# Patient Record
Sex: Female | Born: 1950 | Race: Black or African American | Hispanic: No | Smoking: Never smoker
Health system: Southern US, Community
[De-identification: ages and names within clinical notes are randomized; demographics above are authoritative.]

## PROBLEM LIST (undated history)

## (undated) DIAGNOSIS — I10 Essential (primary) hypertension: Secondary | ICD-10-CM

## (undated) DIAGNOSIS — T4145XA Adverse effect of unspecified anesthetic, initial encounter: Secondary | ICD-10-CM

## (undated) DIAGNOSIS — C801 Malignant (primary) neoplasm, unspecified: Secondary | ICD-10-CM

## (undated) DIAGNOSIS — Z9109 Other allergy status, other than to drugs and biological substances: Secondary | ICD-10-CM

## (undated) DIAGNOSIS — K812 Acute cholecystitis with chronic cholecystitis: Principal | ICD-10-CM

## (undated) DIAGNOSIS — K8020 Calculus of gallbladder without cholecystitis without obstruction: Secondary | ICD-10-CM

## (undated) DIAGNOSIS — E039 Hypothyroidism, unspecified: Secondary | ICD-10-CM

## (undated) DIAGNOSIS — E079 Disorder of thyroid, unspecified: Secondary | ICD-10-CM

## (undated) DIAGNOSIS — D649 Anemia, unspecified: Secondary | ICD-10-CM

## (undated) DIAGNOSIS — T7840XA Allergy, unspecified, initial encounter: Secondary | ICD-10-CM

## (undated) HISTORY — DX: Disorder of thyroid, unspecified: E07.9

## (undated) HISTORY — PX: POLYPECTOMY: SHX149

## (undated) HISTORY — PX: DILATION AND CURETTAGE OF UTERUS: SHX78

## (undated) HISTORY — DX: Anemia, unspecified: D64.9

## (undated) HISTORY — DX: Other allergy status, other than to drugs and biological substances: Z91.09

## (undated) HISTORY — DX: Acute cholecystitis with chronic cholecystitis: K81.2

## (undated) HISTORY — PX: ABDOMINAL HYSTERECTOMY: SHX81

## (undated) HISTORY — PX: TUBAL LIGATION: SHX77

## (undated) HISTORY — DX: Essential (primary) hypertension: I10

## (undated) HISTORY — PX: SIGMOIDOSCOPY: SUR1295

## (undated) HISTORY — DX: Allergy, unspecified, initial encounter: T78.40XA

## (undated) HISTORY — PX: COLONOSCOPY: SHX174

## (undated) HISTORY — PX: COLON SURGERY: SHX602

## (undated) HISTORY — DX: Malignant (primary) neoplasm, unspecified: C80.1

## (undated) HISTORY — DX: Calculus of gallbladder without cholecystitis without obstruction: K80.20

---

## 1998-07-29 ENCOUNTER — Inpatient Hospital Stay (HOSPITAL_COMMUNITY): Admission: RE | Admit: 1998-07-29 | Discharge: 1998-07-31 | Payer: Self-pay | Admitting: Gynecology

## 1998-12-08 ENCOUNTER — Other Ambulatory Visit: Admission: RE | Admit: 1998-12-08 | Discharge: 1998-12-08 | Payer: Self-pay | Admitting: Gynecology

## 1999-08-17 ENCOUNTER — Emergency Department (HOSPITAL_COMMUNITY): Admission: EM | Admit: 1999-08-17 | Discharge: 1999-08-17 | Payer: Self-pay | Admitting: Emergency Medicine

## 1999-08-17 ENCOUNTER — Encounter: Payer: Self-pay | Admitting: Emergency Medicine

## 1999-08-26 ENCOUNTER — Emergency Department (HOSPITAL_COMMUNITY): Admission: EM | Admit: 1999-08-26 | Discharge: 1999-08-26 | Payer: Self-pay | Admitting: Internal Medicine

## 1999-08-30 ENCOUNTER — Emergency Department (HOSPITAL_COMMUNITY): Admission: EM | Admit: 1999-08-30 | Discharge: 1999-08-30 | Payer: Self-pay

## 1999-10-07 ENCOUNTER — Encounter: Payer: Self-pay | Admitting: Internal Medicine

## 1999-10-07 ENCOUNTER — Encounter: Admission: RE | Admit: 1999-10-07 | Discharge: 1999-10-07 | Payer: Self-pay | Admitting: Internal Medicine

## 1999-12-13 ENCOUNTER — Other Ambulatory Visit: Admission: RE | Admit: 1999-12-13 | Discharge: 1999-12-13 | Payer: Self-pay | Admitting: Gynecology

## 2000-10-10 ENCOUNTER — Encounter: Payer: Self-pay | Admitting: Internal Medicine

## 2000-10-10 ENCOUNTER — Encounter: Admission: RE | Admit: 2000-10-10 | Discharge: 2000-10-10 | Payer: Self-pay | Admitting: Internal Medicine

## 2000-12-13 ENCOUNTER — Other Ambulatory Visit: Admission: RE | Admit: 2000-12-13 | Discharge: 2000-12-13 | Payer: Self-pay | Admitting: Gynecology

## 2001-10-11 ENCOUNTER — Encounter: Payer: Self-pay | Admitting: Internal Medicine

## 2001-10-11 ENCOUNTER — Encounter: Admission: RE | Admit: 2001-10-11 | Discharge: 2001-10-11 | Payer: Self-pay | Admitting: Internal Medicine

## 2001-12-14 ENCOUNTER — Other Ambulatory Visit: Admission: RE | Admit: 2001-12-14 | Discharge: 2001-12-14 | Payer: Self-pay | Admitting: Gynecology

## 2002-10-14 ENCOUNTER — Encounter: Admission: RE | Admit: 2002-10-14 | Discharge: 2002-10-14 | Payer: Self-pay | Admitting: Internal Medicine

## 2002-10-14 ENCOUNTER — Encounter: Payer: Self-pay | Admitting: Internal Medicine

## 2002-11-22 ENCOUNTER — Ambulatory Visit (HOSPITAL_COMMUNITY): Admission: RE | Admit: 2002-11-22 | Discharge: 2002-11-22 | Payer: Self-pay | Admitting: Internal Medicine

## 2003-01-01 ENCOUNTER — Other Ambulatory Visit: Admission: RE | Admit: 2003-01-01 | Discharge: 2003-01-01 | Payer: Self-pay | Admitting: Gynecology

## 2003-10-15 ENCOUNTER — Encounter: Admission: RE | Admit: 2003-10-15 | Discharge: 2003-10-15 | Payer: Self-pay | Admitting: Internal Medicine

## 2003-12-15 ENCOUNTER — Other Ambulatory Visit: Admission: RE | Admit: 2003-12-15 | Discharge: 2003-12-15 | Payer: Self-pay | Admitting: Gynecology

## 2004-11-03 ENCOUNTER — Encounter: Admission: RE | Admit: 2004-11-03 | Discharge: 2004-11-03 | Payer: Self-pay | Admitting: Internal Medicine

## 2006-10-05 ENCOUNTER — Ambulatory Visit (HOSPITAL_COMMUNITY): Admission: RE | Admit: 2006-10-05 | Discharge: 2006-10-05 | Payer: Self-pay | Admitting: Internal Medicine

## 2006-10-24 ENCOUNTER — Encounter: Admission: RE | Admit: 2006-10-24 | Discharge: 2006-10-24 | Payer: Self-pay | Admitting: Internal Medicine

## 2008-02-11 ENCOUNTER — Ambulatory Visit (HOSPITAL_COMMUNITY): Admission: RE | Admit: 2008-02-11 | Discharge: 2008-02-11 | Payer: Self-pay | Admitting: Internal Medicine

## 2009-02-24 ENCOUNTER — Ambulatory Visit (HOSPITAL_COMMUNITY): Admission: RE | Admit: 2009-02-24 | Discharge: 2009-02-24 | Payer: Self-pay | Admitting: Internal Medicine

## 2009-03-04 ENCOUNTER — Encounter: Admission: RE | Admit: 2009-03-04 | Discharge: 2009-03-04 | Payer: Self-pay | Admitting: Internal Medicine

## 2009-12-01 ENCOUNTER — Encounter (INDEPENDENT_AMBULATORY_CARE_PROVIDER_SITE_OTHER): Payer: Self-pay | Admitting: *Deleted

## 2010-03-12 ENCOUNTER — Ambulatory Visit (HOSPITAL_COMMUNITY): Admission: RE | Admit: 2010-03-12 | Discharge: 2010-03-12 | Payer: Self-pay | Admitting: Internal Medicine

## 2010-08-02 ENCOUNTER — Encounter: Payer: Self-pay | Admitting: Internal Medicine

## 2010-08-12 NOTE — Letter (Signed)
Summary: Colonoscopy Letter  [facility]  [address] [place], [place] [address]   Phone: [phone]  Fax: [phone]      Dec 01, 2009 MRN: [id number]   [facility] [address] [place], [place]  [address]   Dear Christine Shepherd,   According to your medical record, it is time for you to schedule a Colonoscopy. The American Cancer Society recommends this procedure as a method to detect early colon cancer. Patients with a family history of colon cancer, or a personal history of colon polyps or inflammatory bowel disease are at increased risk.  This letter has beeen generated based on the recommendations made at the time of your procedure. If you feel that in your particular situation this may no longer apply, please contact our office.  Please call our office at [phone] to schedule this appointment or to update your records at your earliest convenience.  Thank you for cooperating with [place] to provide you with the very best care possible.   Sincerely,  Hedwig Morton. Juanda Chance, M.D.  [facility] [phone]

## 2011-02-16 ENCOUNTER — Telehealth: Payer: Self-pay | Admitting: *Deleted

## 2011-02-16 NOTE — Telephone Encounter (Signed)
Patient had a colonoscopy 12/10/02 which showed colon polyps. She is now overdue for colonoscopy (5-7 year recall). I have left a message for patient to call back.

## 2011-02-23 NOTE — Telephone Encounter (Signed)
Left message for patient to call back  

## 2011-03-02 NOTE — Telephone Encounter (Signed)
Patient never call back. We will send a letter.

## 2011-03-03 ENCOUNTER — Other Ambulatory Visit (HOSPITAL_COMMUNITY): Payer: Self-pay | Admitting: Internal Medicine

## 2011-03-03 DIAGNOSIS — Z1231 Encounter for screening mammogram for malignant neoplasm of breast: Secondary | ICD-10-CM

## 2011-03-15 ENCOUNTER — Ambulatory Visit (HOSPITAL_COMMUNITY)
Admission: RE | Admit: 2011-03-15 | Discharge: 2011-03-15 | Disposition: A | Discharge status: Home / Self Care | Payer: BC Managed Care – PPO | Source: Ambulatory Visit | Attending: Internal Medicine | Admitting: Internal Medicine

## 2011-03-15 DIAGNOSIS — Z1231 Encounter for screening mammogram for malignant neoplasm of breast: Secondary | ICD-10-CM | POA: Insufficient documentation

## 2011-08-24 ENCOUNTER — Ambulatory Visit (INDEPENDENT_AMBULATORY_CARE_PROVIDER_SITE_OTHER): Payer: BC Managed Care – PPO | Admitting: Physician Assistant

## 2011-08-24 VITALS — BP 178/86 | HR 76 | Temp 98.6°F | Resp 16 | Ht 64.5 in | Wt 171.0 lb

## 2011-08-24 DIAGNOSIS — R110 Nausea: Secondary | ICD-10-CM

## 2011-08-24 DIAGNOSIS — R82998 Other abnormal findings in urine: Secondary | ICD-10-CM

## 2011-08-24 DIAGNOSIS — R8271 Bacteriuria: Secondary | ICD-10-CM

## 2011-08-24 LAB — POCT CBC
Granulocyte percent: 46.4 %G (ref 37–80)
MCV: 90.7 fL (ref 80–97)
MID (cbc): 0.3 (ref 0–0.9)
POC Granulocyte: 2 (ref 2–6.9)
POC LYMPH PERCENT: 45.8 %L (ref 10–50)
Platelet Count, POC: 236 10*3/uL (ref 142–424)
RBC: 4.58 M/uL (ref 4.04–5.48)
RDW, POC: 13.4 %

## 2011-08-24 LAB — POCT URINALYSIS DIPSTICK
Leukocytes, UA: NEGATIVE
Protein, UA: 30
Spec Grav, UA: 1.025
Urobilinogen, UA: 0.2
pH, UA: 6

## 2011-08-24 LAB — GLUCOSE, POCT (MANUAL RESULT ENTRY): POC Glucose: 106

## 2011-08-24 LAB — POCT UA - MICROSCOPIC ONLY
Casts, Ur, LPF, POC: NEGATIVE
Crystals, Ur, HPF, POC: NEGATIVE

## 2011-08-24 MED ORDER — ONDANSETRON 8 MG PO TBDP: 8.0000 mg | ORAL_TABLET | Freq: Three times a day (TID) | ORAL | Status: AC | PRN

## 2011-08-24 NOTE — Progress Notes (Signed)
  Subjective:    Patient ID: Christine Shepherd, female    DOB: [birth year], 61 y.o.   MRN: [id number]  HPI This patient presents complaining of GI upset. A little bit of nausea but no vomiting. No diarrhea. She had similar symptoms about 2 weeks ago and saw her primary care provider. Her symptoms improved until yesterday. She is frustrated because she thought she was "over this."  No urinary symptoms. No melena or hematochezia. No headache, dizziness or visual disturbance. No unusual foods. No change in medications. New activities.   Review of Systems As above.    Objective:   Physical Exam Vital signs are noted. She states that her blood pressure is usually up at the doctor's office. She is awake, alert and oriented in no acute distress. Neck is supple and nontender without lymphadenopathy or thyromegaly. Heart and lungs are clear to auscultation. Abdomen has normoactive bowel sounds, is supple and nontender without masses or organomegaly. Peripheral pulses are symmetrically strong. No rash.  Results for orders placed in visit on 08/24/11  POCT UA - MICROSCOPIC ONLY      Component Value Range   WBC, Ur, HPF, POC 0-2     RBC, urine, microscopic neg     Bacteria, U Microscopic 3+     Mucus, UA mod     Epithelial cells, urine per micros 10-15     Crystals, Ur, HPF, POC neg     Casts, Ur, LPF, POC neg     Yeast, UA neg    POCT URINALYSIS DIPSTICK      Component Value Range   Color, UA yellow     Clarity, UA clear     Glucose, UA neg     Bilirubin, UA neg     Ketones, UA trace     Spec Grav, UA 1.025     Blood, UA neg     pH, UA 6.0     Protein, UA 30     Urobilinogen, UA 0.2     Nitrite, UA neg     Leukocytes, UA Negative    GLUCOSE, POCT (MANUAL RESULT ENTRY)      Component Value Range   POC Glucose 106    POCT CBC      Component Value Range   WBC 4.3 (*) 4.6 - 10.2 (K/uL)   Lymph, poc 2.0  0.6 - 3.4    POC LYMPH PERCENT 45.8  10 - 50 (%L)   MID (cbc) 0.3  0 - 0.9    POC MID  % 7.8  0 - 12 (%M)   POC Granulocyte 2.0  2 - 6.9    Granulocyte percent 46.4  37 - 80 (%G)   RBC 4.58  4.04 - 5.48 (M/uL)   Hemoglobin 13.2  12.2 - 16.2 (g/dL)   HCT, POC 30.8  65.7 - 47.9 (%)   MCV 90.7  80 - 97 (fL)   MCH, POC 28.8  27 - 31.2 (pg)   MCHC 31.8  31.8 - 35.4 (g/dL)   RDW, POC 84.6     Platelet Count, POC 236  142 - 424 (K/uL)   MPV 10.1  0 - 99.8 (fL)   Urine culture is pending.     Assessment & Plan:  Nausea, GI upset. I suspect this is a relapse of a recent GI viral illness. Supportive care. I doubt a UTI but given the bacteria in the urine a urine culture has been sent. Zofran when necessary. Anticipatory guidance provided.

## 2011-08-24 NOTE — Patient Instructions (Signed)
Be sure to drink plenty of liquids and get as much rest as possible.  Your urine has been sent out for a culture to verify that you do not have a urinary tract infection causing your symptoms.

## 2011-08-24 NOTE — Progress Notes (Signed)
  Subjective:    Patient ID: Christine Shepherd, female    DOB: 1950/10/19, 61 y.o.   MRN: [id number]  HPI    Review of Systems     Objective:   Physical Exam Neck exam is remarkable for enlarged thyroid on the right, patient states this is her baseline. No discrete masses or nodules. No cervical lymphadenopathy. No clavicular lymphadenopathy.       Assessment & Plan:

## 2011-08-26 LAB — URINE CULTURE: Colony Count: 70000

## 2011-08-31 ENCOUNTER — Telehealth: Payer: Self-pay | Admitting: Radiology

## 2011-08-31 NOTE — Telephone Encounter (Signed)
I spoke to patient about her urine culture advised to return if not better, she is feeling much better. She will call back if further is needed Christine Shepherd

## 2011-11-13 ENCOUNTER — Encounter (HOSPITAL_COMMUNITY): Payer: Self-pay

## 2011-11-13 ENCOUNTER — Emergency Department (HOSPITAL_COMMUNITY)
Admission: EM | Admit: 2011-11-13 | Discharge: 2011-11-13 | Disposition: A | Discharge status: Home / Self Care | Payer: BC Managed Care – PPO | Attending: Emergency Medicine | Admitting: Emergency Medicine

## 2011-11-13 ENCOUNTER — Emergency Department (HOSPITAL_COMMUNITY): Payer: BC Managed Care – PPO

## 2011-11-13 DIAGNOSIS — E876 Hypokalemia: Secondary | ICD-10-CM | POA: Insufficient documentation

## 2011-11-13 DIAGNOSIS — K8020 Calculus of gallbladder without cholecystitis without obstruction: Secondary | ICD-10-CM | POA: Insufficient documentation

## 2011-11-13 DIAGNOSIS — E079 Disorder of thyroid, unspecified: Secondary | ICD-10-CM | POA: Insufficient documentation

## 2011-11-13 DIAGNOSIS — I10 Essential (primary) hypertension: Secondary | ICD-10-CM | POA: Insufficient documentation

## 2011-11-13 DIAGNOSIS — R1013 Epigastric pain: Secondary | ICD-10-CM | POA: Insufficient documentation

## 2011-11-13 DIAGNOSIS — R112 Nausea with vomiting, unspecified: Secondary | ICD-10-CM | POA: Insufficient documentation

## 2011-11-13 LAB — COMPREHENSIVE METABOLIC PANEL
ALT: 14 U/L (ref 0–35)
Albumin: 4 g/dL (ref 3.5–5.2)
Alkaline Phosphatase: 45 U/L (ref 39–117)
Calcium: 9.8 mg/dL (ref 8.4–10.5)
GFR calc Af Amer: 90 mL/min — ABNORMAL LOW (ref >90)
Glucose, Bld: 208 mg/dL — ABNORMAL HIGH (ref 70–99)
Potassium: 2.6 mEq/L — CL (ref 3.5–5.1)
Sodium: 136 mEq/L (ref 135–145)
Total Protein: 7.5 g/dL (ref 6.0–8.3)

## 2011-11-13 LAB — URINALYSIS, ROUTINE W REFLEX MICROSCOPIC
Bilirubin Urine: NEGATIVE
Glucose, UA: >1000 mg/dL — AB
Leukocytes, UA: NEGATIVE
Nitrite: NEGATIVE
Specific Gravity, Urine: 1.027 (ref 1.005–1.030)
pH: 7.5 (ref 5.0–8.0)

## 2011-11-13 LAB — URINE MICROSCOPIC-ADD ON

## 2011-11-13 LAB — DIFFERENTIAL
Basophils Absolute: 0 10*3/uL (ref 0.0–0.1)
Basophils Relative: 0 % (ref 0–1)
Eosinophils Absolute: 0 10*3/uL (ref 0.0–0.7)
Eosinophils Relative: 0 % (ref 0–5)
Lymphs Abs: 0.6 10*3/uL — ABNORMAL LOW (ref 0.7–4.0)
Neutrophils Relative %: 90 % — ABNORMAL HIGH (ref 43–77)

## 2011-11-13 LAB — CBC
MCH: 30.2 pg (ref 26.0–34.0)
MCHC: 34.2 g/dL (ref 30.0–36.0)
MCV: 88.3 fL (ref 78.0–100.0)
Platelets: 215 10*3/uL (ref 150–400)
RBC: 4.2 MIL/uL (ref 3.87–5.11)
RDW: 13.2 % (ref 11.5–15.5)

## 2011-11-13 MED ORDER — POTASSIUM CHLORIDE CRYS ER 20 MEQ PO TBCR
40.0000 meq | EXTENDED_RELEASE_TABLET | Freq: Once | ORAL | Status: AC
  Administered 2011-11-13: 40 meq via ORAL
  Filled 2011-11-13: qty 2

## 2011-11-13 MED ORDER — SODIUM CHLORIDE 0.9 % IV SOLN
1000.0000 mL | Freq: Once | INTRAVENOUS | Status: AC
  Administered 2011-11-13: 1000 mL via INTRAVENOUS

## 2011-11-13 MED ORDER — ONDANSETRON HCL 4 MG/2ML IJ SOLN
4.0000 mg | Freq: Once | INTRAMUSCULAR | Status: AC
  Administered 2011-11-13: 4 mg via INTRAVENOUS
  Filled 2011-11-13: qty 2

## 2011-11-13 MED ORDER — POTASSIUM CHLORIDE 10 MEQ/100ML IV SOLN
10.0000 meq | Freq: Once | INTRAVENOUS | Status: AC
  Administered 2011-11-13: 10 meq via INTRAVENOUS
  Filled 2011-11-13: qty 100

## 2011-11-13 MED ORDER — HYDROMORPHONE HCL PF 1 MG/ML IJ SOLN
1.0000 mg | Freq: Once | INTRAMUSCULAR | Status: AC
  Administered 2011-11-13: 1 mg via INTRAVENOUS
  Filled 2011-11-13: qty 1

## 2011-11-13 MED ORDER — ONDANSETRON 8 MG PO TBDP: 8.0000 mg | ORAL_TABLET | Freq: Three times a day (TID) | ORAL | Status: AC | PRN

## 2011-11-13 MED ORDER — HYDROCODONE-ACETAMINOPHEN 5-325 MG PO TABS: 1.0000 | ORAL_TABLET | Freq: Four times a day (QID) | ORAL | Status: DC | PRN

## 2011-11-13 MED ORDER — SODIUM CHLORIDE 0.9 % IV SOLN
1000.0000 mL | INTRAVENOUS | Status: DC
  Administered 2011-11-13: 1000 mL via INTRAVENOUS

## 2011-11-13 NOTE — ED Notes (Signed)
Pt states that she began having "gas pains" around 3 am.  Pt has been having NV, denies diarrhea.  Took a laxative to help but it hasn't.

## 2011-11-13 NOTE — ED Notes (Signed)
Pt in from home with acute onset of abd pain with n/vdenies diarrhea states last bm was this am states pain is mid abd area radiating to the back states pain sharp at times

## 2011-11-13 NOTE — Discharge Instructions (Signed)
Gallstones Gallstones are a form of gallbladder disease. The gallbladder is a small organ that helps you digest food.  HOME CARE  Only take medicine as told by your doctor.   Eat a low-fat diet.   Follow up as told.  GET HELP RIGHT AWAY IF:   Your pain gets worse.   You develop yellow skin or eyes (jaundice).   The pain moves to another part of your belly (abdomen) or back.   You have a temperature by mouth above 102 F (38.9 C), not controlled by medicine.   You feel sick to your stomach (nauseous) and throw up (vomit).  MAKE SURE YOU:   Understand these instructions.   Will watch your condition.   Will get help right away if you are not doing well or get worse.  Document Released: 12/14/2007 Document Revised: 06/16/2011 Document Reviewed: 06/02/2009 [facility] Patient Information 2012 [place], [place].Cholesterol Control Diet Cholesterol levels in your body are determined significantly by your diet. Cholesterol levels may also be related to heart disease. The following material helps to explain this relationship and discusses what you can do to help keep your heart healthy. Not all cholesterol is bad. Low-density lipoprotein (LDL) cholesterol is the "bad" cholesterol. It may cause fatty deposits to build up inside your arteries. High-density lipoprotein (HDL) cholesterol is "good." It helps to remove the "bad" LDL cholesterol from your blood. Cholesterol is a very important risk factor for heart disease. Other risk factors are high blood pressure, smoking, stress, heredity, and weight. The heart muscle gets its supply of blood through the coronary arteries. If your LDL cholesterol is high and your HDL cholesterol is low, you are at risk for having fatty deposits build up in your coronary arteries. This leaves less room through which blood can flow. Without sufficient blood and oxygen, the heart muscle cannot function properly and you may feel chest pains (angina pectoris). When a  coronary artery closes up entirely, a part of the heart muscle may die, causing a heart attack (myocardial infarction). CHECKING CHOLESTEROL When your caregiver sends your blood to a lab to be analyzed for cholesterol, a complete lipid (fat) profile may be done. With this test, the total amount of cholesterol and levels of LDL and HDL are determined. Triglycerides are a type of fat that circulates in the blood and can also be used to determine heart disease risk. The list below describes what the numbers should be: Test: Total Cholesterol.  Less than 200 mg/dl.  Test: LDL "bad cholesterol."  Less than 100 mg/dl.   Less than 70 mg/dl if you are at very high risk of a heart attack or sudden cardiac death.  Test: HDL "good cholesterol."  Greater than 50 mg/dl for women.   Greater than 40 mg/dl for men.  Test: Triglycerides.  Less than 150 mg/dl.  CONTROLLING CHOLESTEROL WITH DIET Although exercise and lifestyle factors are important, your diet is key. That is because certain foods are known to raise cholesterol and others to lower it. The goal is to balance foods for their effect on cholesterol and more importantly, to replace saturated and trans fat with other types of fat, such as monounsaturated fat, polyunsaturated fat, and omega-3 fatty acids. On average, a person should consume no more than 15 to 17 g of saturated fat daily. Saturated and trans fats are considered "bad" fats, and they will raise LDL cholesterol. Saturated fats are primarily found in animal products such as meats, butter, and cream. However, that does not  mean you need to sacrifice all your favorite foods. Today, there are good tasting, low-fat, low-cholesterol substitutes for most of the things you like to eat. Choose low-fat or nonfat alternatives. Choose round or loin cuts of red meat, since these types of cuts are lowest in fat and cholesterol. Chicken (without the skin), fish, veal, and ground [place] breast are excellent  choices. Eliminate fatty meats, such as hot dogs and salami. Even shellfish have little or no saturated fat. Have a 3 oz (85 g) portion when you eat lean meat, poultry, or fish. Trans fats are also called "partially hydrogenated oils." They are oils that have been scientifically manipulated so that they are solid at room temperature resulting in a longer shelf life and improved taste and texture of foods in which they are added. Trans fats are found in stick margarine, some tub margarines, cookies, crackers, and baked goods.  When baking and cooking, oils are an excellent substitute for butter. The monounsaturated oils are especially beneficial since it is believed they lower LDL and raise HDL. The oils you should avoid entirely are saturated tropical oils, such as coconut and palm.  Remember to eat liberally from food groups that are naturally free of saturated and trans fat, including fish, fruit, vegetables, beans, grains (barley, rice, couscous, bulgur wheat), and pasta (without cream sauces).  IDENTIFYING FOODS THAT LOWER CHOLESTEROL  Soluble fiber may lower your cholesterol. This type of fiber is found in fruits such as apples, vegetables such as broccoli, potatoes, and carrots, legumes such as beans, peas, and lentils, and grains such as barley. Foods fortified with plant sterols (phytosterol) may also lower cholesterol. You should eat at least 2 g per day of these foods for a cholesterol lowering effect.  Read package labels to identify low-saturated fats, trans fats free, and low-fat foods at the supermarket. Select cheeses that have only 2 to 3 g saturated fat per ounce. Use a heart-healthy tub margarine that is free of trans fats or partially hydrogenated oil. When buying baked goods (cookies, crackers), avoid partially hydrogenated oils. Breads and muffins should be made from whole grains (whole-wheat or whole oat flour, instead of "flour" or "enriched flour"). Buy non-creamy canned soups with  reduced salt and no added fats.  FOOD PREPARATION TECHNIQUES  Never deep-fry. If you must fry, either stir-fry, which uses very little fat, or use non-stick cooking sprays. When possible, broil, bake, or roast meats, and steam vegetables. Instead of dressing vegetables with butter or margarine, use lemon and herbs, applesauce and cinnamon (for squash and sweet potatoes), nonfat yogurt, salsa, and low-fat dressings for salads.  LOW-SATURATED FAT / LOW-FAT FOOD SUBSTITUTES Meats / Saturated Fat (g)  Avoid: Steak, marbled (3 oz/85 g) / 11 g   Choose: Steak, lean (3 oz/85 g) / 4 g   Avoid: Hamburger (3 oz/85 g) / 7 g   Choose: Hamburger, lean (3 oz/85 g) / 5 g   Avoid: Ham (3 oz/85 g) / 6 g   Choose: Ham, lean cut (3 oz/85 g) / 2.4 g   Avoid: Chicken, with skin, dark meat (3 oz/85 g) / 4 g   Choose: Chicken, skin removed, dark meat (3 oz/85 g) / 2 g   Avoid: Chicken, with skin, light meat (3 oz/85 g) / 2.5 g   Choose: Chicken, skin removed, light meat (3 oz/85 g) / 1 g  Dairy / Saturated Fat (g)  Avoid: Whole milk (1 cup) / 5 g   Choose: Low-fat milk, 2% (  1 cup) / 3 g   Choose: Low-fat milk, 1% (1 cup) / 1.5 g   Choose: Skim milk (1 cup) / 0.3 g   Avoid: Hard cheese (1 oz/28 g) / 6 g   Choose: Skim milk cheese (1 oz/28 g) / 2 to 3 g   Avoid: Cottage cheese, 4% fat (1 cup) / 6.5 g   Choose: Low-fat cottage cheese, 1% fat (1 cup) / 1.5 g   Avoid: Ice cream (1 cup) / 9 g   Choose: Sherbet (1 cup) / 2.5 g   Choose: Nonfat frozen yogurt (1 cup) / 0.3 g   Choose: Frozen fruit bar / trace   Avoid: Whipped cream (1 tbs) / 3.5 g   Choose: Nondairy whipped topping (1 tbs) / 1 g  Condiments / Saturated Fat (g)  Avoid: Mayonnaise (1 tbs) / 2 g   Choose: Low-fat mayonnaise (1 tbs) / 1 g   Avoid: Butter (1 tbs) / 7 g   Choose: Extra light margarine (1 tbs) / 1 g   Avoid: Coconut oil (1 tbs) / 11.8 g   Choose: Olive oil (1 tbs) / 1.8 g   Choose: Corn oil (1 tbs) / 1.7  g   Choose: Safflower oil (1 tbs) / 1.2 g   Choose: Sunflower oil (1 tbs) / 1.4 g   Choose: Soybean oil (1 tbs) / 2.4 g   Choose: Canola oil (1 tbs) / 1 g  Document Released: 06/27/2005 Document Revised: 06/16/2011 Document Reviewed: 12/16/2010 [facility] Patient Information 2012 [place], [place].

## 2011-11-13 NOTE — ED Provider Notes (Signed)
History     CSN: [id number]  Arrival date & time 11/13/11  5621   First MD Initiated Contact with Patient 11/13/11 0840      Chief Complaint  Patient presents with  . Abdominal Pain  . Nausea  . Emesis    HPI Patient presents to the emergency room with complaints of abdominal pain associated with nausea and vomiting. Patient states she woke up at about 3 AM with severe pain in her upper abdomen that radiates somewhat to her back. The pain is sharp and severe. She feels like she somewhat bloated. Patient denies any diarrhea. She did have a bowel movement this morning. She has not noticed any blood or urine, dysuria, fever, coughing, chest pain. Nothing seems to making this pain better. Palpation and movement do make it worse. The patient never his symptoms like this before.  Her only prior surgical history is a hysterectomy.  Past Medical History  Diagnosis Date  . Hypertension   . Thyroid disease     Past Surgical History  Procedure Date  . Abdominal hysterectomy     Family History  Problem Relation Age of Onset  . Cancer Father     Prostate Ca    History  Substance Use Topics  . Smoking status: Never Smoker   . Smokeless tobacco: Not on file  . Alcohol Use: No    OB History    Grav Para Term Preterm Abortions TAB SAB Ect Mult Living                  Review of Systems  All other systems reviewed and are negative.    Allergies  Review of patient's allergies indicates no known allergies.  Home Medications   Current Outpatient Rx  Name Route Sig Dispense Refill  . ESTRADIOL 0.05 MG/24HR TD PTTW Transdermal Place 1 patch onto the skin 2 (two) times a week.    [name] LEVOTHYROXINE SODIUM 75 MCG PO TABS Oral Take 75 mcg by mouth daily.    [name] VALSARTAN-HYDROCHLOROTHIAZIDE 160-25 MG PO TABS Oral Take 1 tablet by mouth daily.      BP 163/62  Pulse 83  Temp(Src) 98 F (36.7 C) (Oral)  Resp 20  SpO2 100%  Physical Exam  Nursing note and vitals  reviewed. Constitutional: She appears well-developed and well-nourished. She appears distressed.  HENT:  Head: Normocephalic and atraumatic.  Right Ear: External ear normal.  Left Ear: External ear normal.  Eyes: Conjunctivae are normal. Right eye exhibits no discharge. Left eye exhibits no discharge. No scleral icterus.  Neck: Neck supple. No tracheal deviation present.  Cardiovascular: Normal rate, regular rhythm and intact distal pulses.   Pulmonary/Chest: Effort normal and breath sounds normal. No stridor. No respiratory distress. She has no wheezes. She has no rales.  Abdominal: Soft. Bowel sounds are normal. She exhibits no distension. There is tenderness in the epigastric area. There is guarding. There is no rigidity, no rebound and no CVA tenderness. No hernia.  Musculoskeletal: She exhibits no edema and no tenderness.  Neurological: She is alert. She has normal strength. No sensory deficit. Cranial nerve deficit:  no gross defecits noted. She exhibits normal muscle tone. She displays no seizure activity. Coordination normal.  Skin: Skin is warm and dry. No rash noted.  Psychiatric: She has a normal mood and affect.    ED Course  Procedures  ED TREATMENT:  Medications  0.9 %  sodium chloride infusion (1000 mL Intravenous New Bag/Given 11/13/11 0910)  Followed by  0.9 %  sodium chloride infusion (not administered)  Multiple Vitamin (MULITIVITAMIN WITH MINERALS) TABS (not administered)  ibuprofen (ADVIL,MOTRIN) 200 MG tablet (not administered)  potassium chloride SA (K-DUR,KLOR-CON) CR tablet 40 mEq (not administered)  potassium chloride 10 mEq in 100 mL IVPB (not administered)  HYDROmorphone (DILAUDID) injection 1 mg (not administered)  HYDROmorphone (DILAUDID) injection 1 mg (1 mg Intravenous Given 11/13/11 0910)  ondansetron (ZOFRAN) injection 4 mg (4 mg Intravenous Given 11/13/11 0910)   LABS: Labs Reviewed  DIFFERENTIAL - Abnormal; Notable for the following:    Neutrophils  Relative 90 (*)    Lymphocytes Relative 7 (*)    Lymphs Abs 0.6 (*)    All other components within normal limits  COMPREHENSIVE METABOLIC PANEL - Abnormal; Notable for the following:    Potassium 2.6 (*)    Glucose, Bld 208 (*)    GFR calc non Af Amer 77 (*)    GFR calc Af Amer 90 (*)    All other components within normal limits  URINALYSIS, ROUTINE W REFLEX MICROSCOPIC - Abnormal; Notable for the following:    Glucose, UA >1000 (*)    Ketones, ur TRACE (*)    All other components within normal limits  URINE MICROSCOPIC-ADD ON - Abnormal; Notable for the following:    Squamous Epithelial / LPF FEW (*)    All other components within normal limits  CBC  LIPASE, BLOOD  XRAYS Dg Abd Acute W/chest  11/13/2011  *RADIOLOGY REPORT*  Clinical Data: Abdominal pain.  Nausea and vomiting.  ACUTE ABDOMEN SERIES (ABDOMEN 2 VIEW & CHEST 1 VIEW) 11/13/2011:  Comparison: None.  Findings: Gas within normal caliber loops of small bowel throughout the abdomen.  Gas and expected amount of stool and normal caliber colon.  Fluid-filled J-shaped stomach.  No free intraperitoneal air.  No air-fluid levels in the small bowel.  No abnormal calcifications.  Mild degenerative changes involving the lumbar spine.  Cardiomediastinal silhouette unremarkable.   Lungs clear. Bronchovascular markings normal.  Pulmonary vascularity normal.  No pneumothorax.  No pleural effusions.  IMPRESSION: No acute abdominal or pulmonary abnormality.  Original Report Authenticated By: Arnell Sieving, M.D.      MDM  10:26 AM IV and oral potassium ordered for her hypokalemia.  Pt states she only vomited once.  The low K may be related to her BP meds.  NO sign of bowel obstruction.  Will US abdomen to evaluate for biliary pathology.  Pt still having some pain.  Additional meds ordered.    12:32 PM Pt is feeling better.  Discussed findings with patient.  No sign of cholecystitis on [place], no elevated WBC and pain has improved. Will dc home  on oral medications.  Follow up with gen surg this week.  Discussed low fat diet.      Celene Kras, MD 11/13/11 [phone]

## 2011-11-15 ENCOUNTER — Encounter (INDEPENDENT_AMBULATORY_CARE_PROVIDER_SITE_OTHER): Payer: Self-pay | Admitting: General Surgery

## 2011-11-15 ENCOUNTER — Encounter (INDEPENDENT_AMBULATORY_CARE_PROVIDER_SITE_OTHER): Payer: Self-pay | Admitting: Surgery

## 2011-11-15 ENCOUNTER — Ambulatory Visit (INDEPENDENT_AMBULATORY_CARE_PROVIDER_SITE_OTHER): Payer: BC Managed Care – PPO | Admitting: Surgery

## 2011-11-15 VITALS — BP 146/78 | HR 62 | Temp 97.6°F | Resp 12 | Ht 66.0 in | Wt 171.0 lb

## 2011-11-15 DIAGNOSIS — K812 Acute cholecystitis with chronic cholecystitis: Secondary | ICD-10-CM | POA: Insufficient documentation

## 2011-11-15 DIAGNOSIS — K8020 Calculus of gallbladder without cholecystitis without obstruction: Secondary | ICD-10-CM

## 2011-11-15 DIAGNOSIS — E042 Nontoxic multinodular goiter: Secondary | ICD-10-CM | POA: Insufficient documentation

## 2011-11-15 DIAGNOSIS — I10 Essential (primary) hypertension: Secondary | ICD-10-CM | POA: Insufficient documentation

## 2011-11-15 HISTORY — DX: Acute cholecystitis with chronic cholecystitis: K81.2

## 2011-11-15 MED ORDER — AMOXICILLIN-POT CLAVULANATE 875-125 MG PO TABS: 1.0000 | ORAL_TABLET | Freq: Two times a day (BID) | ORAL | Status: AC

## 2011-11-15 NOTE — Progress Notes (Signed)
Subjective:     Patient ID: Christine Shepherd, female   DOB: 07/13/1950, 60 y.o.   MRN: [id number]  HPI  Christine Shepherd  08/05/1950 [id number]  Patient Care Team: Edwin Jay Green, MD as PCP - General (Internal Medicine) Chelle S Jeffery, PA-C as Physician Assistant (Physician Assistant)  This patient is a 60 y.o.female who presents today for surgical evaluation at the request of Dr. Knapp.   Pleasant middle-aged female who's had episodes of severe upper abdominal pain. Worse on the right. Radiates to the back. She does enjoy fried foods but is headless tolerance for that. She usually tries to take gas pills such as Gas-X. She has mild heartburn for which controls it. This pain was different.  She had a more severe attack earlier in the week. Felt severely nauseated. She vomited. It did not help. She went to the emergency room. Workup showed gallstones. Began to feel a little better. Based on concerns, Dr. Knapp with the emergency department sent the patient for surgical evaluation to consider cholecystectomy to relieve her problems.  She was sent to us for close followup.  Does not drink alcohol much. No history of hepatitis or pancreatitis. Can walk 30 minutes without difficulty. No pulmonary issues. This thyroid issues which are stable. Probable goiter. No globus or dysphasia. No problems with severely spicy foods.    Patient Active Problem List  Diagnoses  . Gallstones  . Hypertension  . Thyroid disease    Past Medical History  Diagnosis Date  . Hypertension   . Thyroid disease   . Gallstones     Past Surgical History  Procedure Date  . Abdominal hysterectomy   . Dilation and curettage of uterus     History   Social History  . Marital Status: Divorced    Spouse Name: N/A    Number of Children: N/A  . Years of Education: N/A   Occupational History  . Not on file.   Social History Main Topics  . Smoking status: Never Smoker   . Smokeless tobacco: Not on file  .  Alcohol Use: No  . Drug Use: No  . Sexually Active: Not on file   Other Topics Concern  . Not on file   Social History Narrative  . No narrative on file    Family History  Problem Relation Age of Onset  . Cancer Father     Prostate Ca  . Cancer Brother     unknown    Current Outpatient Prescriptions  Medication Sig Dispense Refill  . estradiol (VIVELLE-DOT) 0.05 MG/24HR Place 1 patch onto the skin 2 (two) times a week.      . HYDROcodone-acetaminophen (NORCO) 5-325 MG per tablet Take 1-2 tablets by mouth every 6 (six) hours as needed for pain.  16 tablet  0  . ibuprofen (ADVIL,MOTRIN) 200 MG tablet Take 200 mg by mouth every 6 (six) hours as needed. Pain      . levothyroxine (SYNTHROID, LEVOTHROID) 75 MCG tablet Take 75 mcg by mouth daily.      . Multiple Vitamin (MULITIVITAMIN WITH MINERALS) TABS Take 1 tablet by mouth daily.      . ondansetron (ZOFRAN ODT) 8 MG disintegrating tablet Take 1 tablet (8 mg total) by mouth every 8 (eight) hours as needed for nausea.  20 tablet  0  . valsartan-hydrochlorothiazide (DIOVAN-HCT) 160-25 MG per tablet Take 1 tablet by mouth daily.         No Known Allergies  BP 146/78    Pulse 62  Temp(Src) 97.6 F (36.4 C) (Temporal)  Resp 12  Ht 5' 6" (1.676 m)  Wt 171 lb (77.565 kg)  BMI 27.60 kg/m2     Review of Systems  Constitutional: Negative for fever, chills, diaphoresis, appetite change and fatigue.  HENT: Negative for ear pain, sore throat, trouble swallowing, neck pain and ear discharge.   Eyes: Negative for photophobia, discharge and visual disturbance.  Respiratory: Negative for cough, choking, chest tightness and shortness of breath.   Cardiovascular: Negative for chest pain and palpitations.  Gastrointestinal: Positive for nausea. Negative for vomiting, abdominal pain, diarrhea, constipation, anal bleeding and rectal pain.  Genitourinary: Negative for dysuria, frequency and difficulty urinating.  Musculoskeletal: Negative for  myalgias and gait problem.  Skin: Negative for color change, pallor and rash.  Neurological: Negative for dizziness, speech difficulty, weakness and numbness.  Hematological: Negative for adenopathy.  Psychiatric/Behavioral: Negative for confusion and agitation. The patient is not nervous/anxious.        Objective:   Physical Exam  Constitutional: She is oriented to person, place, and time. She appears well-developed and well-nourished. No distress.  HENT:  Head: Normocephalic.  Mouth/Throat: Oropharynx is clear and moist. No oropharyngeal exudate.  Eyes: Conjunctivae and EOM are normal. Pupils are equal, round, and reactive to light. No scleral icterus.  Neck: Normal range of motion. Neck supple. No tracheal deviation present.  Cardiovascular: Normal rate, regular rhythm and intact distal pulses.   Pulmonary/Chest: Effort normal and breath sounds normal. No respiratory distress. She exhibits no tenderness.  Abdominal: Soft. Bowel sounds are normal. She exhibits no distension, no pulsatile liver, no abdominal bruit and no mass. There is tenderness in the right upper quadrant. There is positive Murphy's sign. There is no rigidity, no guarding, no CVA tenderness and no tenderness at McBurney's point. No hernia. Hernia confirmed negative in the right inguinal area and confirmed negative in the left inguinal area.       Obese but soft  Genitourinary: No vaginal discharge found.  Musculoskeletal: Normal range of motion. She exhibits no tenderness.  Lymphadenopathy:    She has no cervical adenopathy.       Right: No inguinal adenopathy present.       Left: No inguinal adenopathy present.  Neurological: She is alert and oriented to person, place, and time. No cranial nerve deficit. She exhibits normal muscle tone. Coordination normal.  Skin: Skin is warm and dry. No rash noted. She is not diaphoretic. No erythema.  Psychiatric: She has a normal mood and affect. Her behavior is normal. Judgment  and thought content normal.   Us Abdomen Complete  11/13/2011  *RADIOLOGY REPORT*  Clinical Data:  Epigastric abdominal pain.  Nausea and vomiting.  COMPLETE ABDOMINAL ULTRASOUND 11/13/2011:  Comparison:  None.  Findings:  Gallbladder:  Multiple shadowing gallstones, the largest on the order of approximately 13 mm.  No gallbladder wall thickening or pericholecystic fluid.  Negative sonographic Murphy's sign according to the ultrasound technologist.  Common bile duct:  Normal in caliber with maximum diameter approximating 4 mm.  Liver:  Normal size and echotexture without focal parenchymal abnormality.  Patent portal vein with hepatopetal flow.  IVC:  Patent.  Pancreas:  Although the pancreas is difficult to visualize in its entirety, no focal pancreatic abnormality is identified.  Spleen:  Normal size and echotexture without focal parenchymal abnormality.  Right Kidney:  No hydronephrosis.  Extrarenal pelvis.  Well- preserved cortex.  No shadowing calculi.  Normal size and parenchymal echotexture without focal   abnormalities.  Approximately 10.6 cm in length.  Left Kidney:  No hydronephrosis.  Well-preserved cortex.  No shadowing calculi.  Normal size and parenchymal echotexture without focal abnormalities.  Approximately 10.4 cm in length.  Abdominal aorta:  Normal in caliber throughout its visualized course in the abdomen without significant atherosclerosis.  IMPRESSION:  1.  Cholelithiasis without CT evidence of acute cholecystitis. 2.  Otherwise normal examination.  Original Report Authenticated By: THOMAS E. LAWRENCE, M.D.   Dg Abd Acute W/chest  11/13/2011  *RADIOLOGY REPORT*  Clinical Data: Abdominal pain.  Nausea and vomiting.  ACUTE ABDOMEN SERIES (ABDOMEN 2 VIEW & CHEST 1 VIEW) 11/13/2011:  Comparison: None.  Findings: Gas within normal caliber loops of small bowel throughout the abdomen.  Gas and expected amount of stool and normal caliber colon.  Fluid-filled J-shaped stomach.  No free intraperitoneal  air.  No air-fluid levels in the small bowel.  No abnormal calcifications.  Mild degenerative changes involving the lumbar spine.  Cardiomediastinal silhouette unremarkable.   Lungs clear. Bronchovascular markings normal.  Pulmonary vascularity normal.  No pneumothorax.  No pleural effusions.  IMPRESSION: No acute abdominal or pulmonary abnormality.  Original Report Authenticated By: THOMAS E. LAWRENCE, M.D.     Assessment:     Cholecystitis, probable acute on chronic    Plan:     I think this requires surgery soon. She's got pain in her face. I will start her on some oral Augmentin. We'll try to figure in the next day or so. She's not severely toxic to the point that she can tolerate anything, so I think we can try to avoid doing immediately tonight. She is interested in proceeding. I discussed the procedure with her.  It is reasonable to start with a single site technique. If the gallbladder is particularly inflamed or infected, then I may need to convert to a standard four-port  The anatomy & physiology of hepatobiliary & pancreatic function was discussed.  The pathophysiology of gallbladder dysfunction was discussed.  Natural history risks without surgery was discussed.   I feel the risks of no intervention will lead to serious problems that outweigh the operative risks; therefore, I recommended cholecystectomy to remove the pathology.  I explained laparoscopic techniques with possible need for an open approach.  Probable cholangiogram to evaluate the bilary tract was explained as well.    Risks such as bleeding, infection, abscess, leak, injury to other organs, need for further treatment, heart attack, death, and other risks were discussed.  I noted a good likelihood this will help address the problem.  Possibility that this will not correct all abdominal symptoms was explained.  Goals of post-operative recovery were discussed as well.  We will work to minimize complications.  An educational handout  further explaining the pathology and treatment options was given as well.  Questions were answered.  The patient expresses understanding & wishes to proceed with surgery.        

## 2011-11-15 NOTE — Patient Instructions (Signed)
Gallbladder Disease  Gallbladder disease (cholecystitis) is an inflammation of your gallbladder. It is usually caused by a build-up of stones (gallstones) or sludge (cholelithiasis) in your gallbladder. The gallbladder is not an essential organ. It is located slightly to the right of center in the belly (abdomen), behind the liver. It stores bile made in the liver. Bile aids in digestion of fats. Gallbladder disease may result in nausea (feeling sick to your stomach), abdominal pain, and jaundice. In severe cases, emergency surgery may be required.  The most common type of gallbladder disease is gallstones. They begin as small crystals and slowly grow into stones. Gallstone pain occurs when the bile duct has spasms. The spasms are caused by the stone passing out of the duct. The stone is trying to pass at the same time bile is passing into the small bowel for digestion. The pain usually begins suddenly. It may persist from several minutes to several hours. Infection can occur. Infection can add to discomfort and severity of an acute attack. The pain may be made worse by breathing deeply or by being jarred. There may be fever and tenderness to the touch. In some cases, when gallstones do not move into the bile duct, people have no pain or symptoms. These are called "silent" gallstones.  Women are three times more likely to develop gallstones than men. Women who have had several pregnancies are more likely to have gallbladder disease. Physicians sometimes advise removing diseased gallbladders before future pregnancies. Other factors that increase the risk of gallbladder disease are obesity, diets heavy in fried foods and dairy products, increasing age, prolonged use of medications containing female hormones, and heredity.  HOME CARE INSTRUCTIONS    If your physician prescribed an antibiotic, take as directed.   Only take over-the-counter or prescription medicines for pain, discomfort, or fever as directed by your  caregiver.   Follow a low fat diet until seen again. (Fat causes the gallbladder to contract.)   Follow-up as instructed. Attacks are almost always recurrent and surgery is usually required for permanent treatment.  SEEK IMMEDIATE MEDICAL CARE IF:    Pain is increasing and not controlled by medications.   The pain moves to another part of your abdomen or to your back. (Right sided pain can be appendicitis and left sided pain in adults can be diverticulitis).   You have a fever.   You develop nausea and vomiting.  Document Released: 06/27/2005 Document Revised: 06/16/2011 Document Reviewed: 05/13/2011  [employer] Patient Information 2012 [employer]

## 2011-11-16 ENCOUNTER — Encounter (HOSPITAL_COMMUNITY)
Admission: RE | Admit: 2011-11-16 | Discharge: 2011-11-16 | Disposition: A | Discharge status: Home / Self Care | Payer: BC Managed Care – PPO | Source: Ambulatory Visit | Attending: Surgery | Admitting: Surgery

## 2011-11-16 ENCOUNTER — Encounter (HOSPITAL_COMMUNITY): Payer: Self-pay | Admitting: Pharmacy Technician

## 2011-11-16 ENCOUNTER — Encounter (HOSPITAL_COMMUNITY): Payer: Self-pay

## 2011-11-16 LAB — BASIC METABOLIC PANEL
BUN: 10 mg/dL (ref 6–23)
CO2: 27 mEq/L (ref 19–32)
Calcium: 9.7 mg/dL (ref 8.4–10.5)
GFR calc non Af Amer: 65 mL/min — ABNORMAL LOW (ref >90)
Glucose, Bld: 131 mg/dL — ABNORMAL HIGH (ref 70–99)
Sodium: 136 mEq/L (ref 135–145)

## 2011-11-16 MED ORDER — CHLORHEXIDINE GLUCONATE 4 % EX LIQD
1.0000 "application " | Freq: Once | CUTANEOUS | Status: DC
  Filled 2011-11-16: qty 15

## 2011-11-16 NOTE — Patient Instructions (Addendum)
20 Christine Shepherd  11/16/2011   Your procedure is scheduled on:  11/17/11  Report to SHORT STAY DEPT  at 12:45 PM.  Call this number if you have problems the morning of surgery: [phone]   Remember:   Do not eat food or drink liquids AFTER MIDNIGHT  May have clear liquids UNTIL 6 HOURS BEFORE SURGERY ( 9:00 AM)  Clear liquids include soda, tea, black coffee, apple or grape juice, broth.  Take these medicines the morning of surgery with A SIP OF WATER: HYDROCODONE / LEVOTHYROXINE   Do not wear jewelry, make-up or nail polish.  Do not wear lotions, powders, or perfumes.   Do not shave legs or underarms 48 hrs. before surgery (men may shave face)  Do not bring valuables to the hospital.  Contacts, dentures or bridgework may not be worn into surgery.  Leave suitcase in the car. After surgery it may be brought to your room.  For patients admitted to the hospital, checkout time is 11:00 AM the day of discharge.   Patients discharged the day of surgery will not be allowed to drive home.    Special Instructions:   Please read over the following fact sheets that you were given: MRSA  Information               SHOWER WITH BETASEPT THE NIGHT BEFORE SURGERY AND THE MORNING OF SURGERY

## 2011-11-17 ENCOUNTER — Encounter (HOSPITAL_COMMUNITY): Payer: Self-pay | Admitting: *Deleted

## 2011-11-17 ENCOUNTER — Ambulatory Visit (HOSPITAL_COMMUNITY): Payer: BC Managed Care – PPO | Admitting: Anesthesiology

## 2011-11-17 ENCOUNTER — Encounter (HOSPITAL_COMMUNITY): Payer: Self-pay | Admitting: Anesthesiology

## 2011-11-17 ENCOUNTER — Encounter (HOSPITAL_COMMUNITY)
Admission: RE | Disposition: A | Discharge status: Home / Self Care | Payer: Self-pay | Source: Ambulatory Visit | Attending: Surgery

## 2011-11-17 ENCOUNTER — Ambulatory Visit (HOSPITAL_COMMUNITY): Payer: BC Managed Care – PPO

## 2011-11-17 ENCOUNTER — Ambulatory Visit (HOSPITAL_COMMUNITY)
Admission: RE | Admit: 2011-11-17 | Discharge: 2011-11-17 | Disposition: A | Discharge status: Home / Self Care | Payer: BC Managed Care – PPO | Source: Ambulatory Visit | Attending: Surgery | Admitting: Surgery

## 2011-11-17 DIAGNOSIS — Z01810 Encounter for preprocedural cardiovascular examination: Secondary | ICD-10-CM | POA: Insufficient documentation

## 2011-11-17 DIAGNOSIS — Z79899 Other long term (current) drug therapy: Secondary | ICD-10-CM | POA: Insufficient documentation

## 2011-11-17 DIAGNOSIS — K8000 Calculus of gallbladder with acute cholecystitis without obstruction: Secondary | ICD-10-CM | POA: Insufficient documentation

## 2011-11-17 DIAGNOSIS — Z01812 Encounter for preprocedural laboratory examination: Secondary | ICD-10-CM | POA: Insufficient documentation

## 2011-11-17 DIAGNOSIS — I10 Essential (primary) hypertension: Secondary | ICD-10-CM | POA: Insufficient documentation

## 2011-11-17 DIAGNOSIS — E669 Obesity, unspecified: Secondary | ICD-10-CM | POA: Insufficient documentation

## 2011-11-17 HISTORY — PX: CHOLECYSTECTOMY: SHX55

## 2011-11-17 SURGERY — LAPAROSCOPIC CHOLECYSTECTOMY SINGLE SITE
Anesthesia: General | Site: Abdomen | Wound class: Clean Contaminated

## 2011-11-17 MED ORDER — GLYCOPYRROLATE 0.2 MG/ML IJ SOLN
INTRAMUSCULAR | Status: DC | PRN
  Administered 2011-11-17: .7 mg via INTRAVENOUS

## 2011-11-17 MED ORDER — SODIUM CHLORIDE 0.9 % IJ SOLN: 3.0000 mL | INTRAMUSCULAR | Status: DC | PRN

## 2011-11-17 MED ORDER — FENTANYL CITRATE 0.05 MG/ML IJ SOLN: 25.0000 ug | INTRAMUSCULAR | Status: DC | PRN

## 2011-11-17 MED ORDER — BUPIVACAINE-EPINEPHRINE 0.25% -1:200000 IJ SOLN
INTRAMUSCULAR | Status: AC
  Filled 2011-11-17: qty 1

## 2011-11-17 MED ORDER — FENTANYL CITRATE 0.05 MG/ML IJ SOLN
INTRAMUSCULAR | Status: AC
  Filled 2011-11-17: qty 2

## 2011-11-17 MED ORDER — ACETAMINOPHEN 650 MG RE SUPP
650.0000 mg | RECTAL | Status: DC | PRN
  Filled 2011-11-17: qty 1

## 2011-11-17 MED ORDER — IOHEXOL 300 MG/ML  SOLN
INTRAMUSCULAR | Status: AC
  Filled 2011-11-17: qty 1

## 2011-11-17 MED ORDER — ONDANSETRON HCL 4 MG/2ML IJ SOLN: 4.0000 mg | Freq: Four times a day (QID) | INTRAMUSCULAR | Status: DC | PRN

## 2011-11-17 MED ORDER — SODIUM CHLORIDE 0.9 % IV SOLN: 250.0000 mL | INTRAVENOUS | Status: DC | PRN

## 2011-11-17 MED ORDER — HYDROCODONE-ACETAMINOPHEN 5-325 MG PO TABS: 1.0000 | ORAL_TABLET | Freq: Four times a day (QID) | ORAL | Status: AC | PRN

## 2011-11-17 MED ORDER — MIDAZOLAM HCL 5 MG/5ML IJ SOLN
INTRAMUSCULAR | Status: DC | PRN
  Administered 2011-11-17: 2 mg via INTRAVENOUS

## 2011-11-17 MED ORDER — ONDANSETRON HCL 4 MG/2ML IJ SOLN
INTRAMUSCULAR | Status: DC | PRN
  Administered 2011-11-17: 4 mg via INTRAVENOUS

## 2011-11-17 MED ORDER — LACTATED RINGERS IV SOLN
INTRAVENOUS | Status: DC
  Administered 2011-11-17: 16:00:00 via INTRAVENOUS
  Administered 2011-11-17: 1000 mL via INTRAVENOUS

## 2011-11-17 MED ORDER — BUPIVACAINE-EPINEPHRINE 0.25% -1:200000 IJ SOLN
INTRAMUSCULAR | Status: DC | PRN
  Administered 2011-11-17: 50 mL

## 2011-11-17 MED ORDER — HYDROCODONE-ACETAMINOPHEN 5-325 MG PO TABS
1.0000 | ORAL_TABLET | Freq: Once | ORAL | Status: AC
  Administered 2011-11-17: 1 via ORAL

## 2011-11-17 MED ORDER — SODIUM CHLORIDE 0.9 % IV SOLN
3.0000 g | INTRAVENOUS | Status: DC | PRN
  Administered 2011-11-17: 3 g via INTRAVENOUS

## 2011-11-17 MED ORDER — ACETAMINOPHEN 325 MG PO TABS: 650.0000 mg | ORAL_TABLET | ORAL | Status: DC | PRN

## 2011-11-17 MED ORDER — FENTANYL CITRATE 0.05 MG/ML IJ SOLN
INTRAMUSCULAR | Status: DC | PRN
  Administered 2011-11-17: 100 ug via INTRAVENOUS
  Administered 2011-11-17 (×3): 50 ug via INTRAVENOUS

## 2011-11-17 MED ORDER — SODIUM CHLORIDE 0.9 % IJ SOLN: 3.0000 mL | Freq: Two times a day (BID) | INTRAMUSCULAR | Status: DC

## 2011-11-17 MED ORDER — LACTATED RINGERS IV SOLN: INTRAVENOUS | Status: DC

## 2011-11-17 MED ORDER — HYDROCODONE-ACETAMINOPHEN 5-325 MG PO TABS
ORAL_TABLET | ORAL | Status: AC
  Administered 2011-11-17: 1 via ORAL
  Filled 2011-11-17: qty 1

## 2011-11-17 MED ORDER — ACETAMINOPHEN 10 MG/ML IV SOLN
INTRAVENOUS | Status: AC
  Filled 2011-11-17: qty 100

## 2011-11-17 MED ORDER — LIDOCAINE HCL (CARDIAC) 20 MG/ML IV SOLN
INTRAVENOUS | Status: DC | PRN
  Administered 2011-11-17: 50 mg via INTRAVENOUS

## 2011-11-17 MED ORDER — FENTANYL CITRATE 0.05 MG/ML IJ SOLN
25.0000 ug | INTRAMUSCULAR | Status: DC | PRN
  Administered 2011-11-17 (×3): 50 ug via INTRAVENOUS

## 2011-11-17 MED ORDER — PROPOFOL 10 MG/ML IV BOLUS
INTRAVENOUS | Status: DC | PRN
  Administered 2011-11-17: 170 mg via INTRAVENOUS

## 2011-11-17 MED ORDER — NEOSTIGMINE METHYLSULFATE 1 MG/ML IJ SOLN
INTRAMUSCULAR | Status: DC | PRN
  Administered 2011-11-17: 4 mg via INTRAVENOUS

## 2011-11-17 MED ORDER — ROCURONIUM BROMIDE 100 MG/10ML IV SOLN
INTRAVENOUS | Status: DC | PRN
  Administered 2011-11-17: 40 mg via INTRAVENOUS
  Administered 2011-11-17: 10 mg via INTRAVENOUS

## 2011-11-17 SURGICAL SUPPLY — 45 items
APPLIER CLIP ROT 10 11.4 M/L (STAPLE) ×2
APR CLP MED LRG 11.4X10 (STAPLE) ×1
BAG SPEC RTRVL LRG 6X4 10 (ENDOMECHANICALS) ×1
CABLE HIGH FREQUENCY MONO STRZ (ELECTRODE) ×2 IMPLANT
CANISTER SUCTION 2500CC (MISCELLANEOUS) ×2 IMPLANT
CLIP APPLIE ROT 10 11.4 M/L (STAPLE) ×1 IMPLANT
CLOTH BEACON ORANGE TIMEOUT ST (SAFETY) ×2 IMPLANT
COVER MAYO STAND STRL (DRAPES) ×2 IMPLANT
DECANTER SPIKE VIAL GLASS SM (MISCELLANEOUS) ×2 IMPLANT
DRAPE C-ARM 42X72 X-RAY (DRAPES) ×2 IMPLANT
DRAPE LAPAROSCOPIC ABDOMINAL (DRAPES) ×2 IMPLANT
DRSG TEGADERM 2-3/8X2-3/4 SM (GAUZE/BANDAGES/DRESSINGS) ×8 IMPLANT
DRSG TEGADERM 4X4.75 (GAUZE/BANDAGES/DRESSINGS) ×3 IMPLANT
ELECT REM PT RETURN 9FT ADLT (ELECTROSURGICAL) ×2
ELECTRODE REM PT RTRN 9FT ADLT (ELECTROSURGICAL) ×1 IMPLANT
ENDOLOOP SUT PDS II  0 18 (SUTURE) ×1
ENDOLOOP SUT PDS II 0 18 (SUTURE) IMPLANT
GAUZE SPONGE 2X2 8PLY STRL LF (GAUZE/BANDAGES/DRESSINGS) IMPLANT
GLOVE BIOGEL PI IND STRL 7.0 (GLOVE) ×1 IMPLANT
GLOVE BIOGEL PI INDICATOR 7.0 (GLOVE) ×1
GLOVE ECLIPSE 8.0 STRL XLNG CF (GLOVE) ×2 IMPLANT
GLOVE INDICATOR 8.0 STRL GRN (GLOVE) ×2 IMPLANT
GOWN STRL NON-REIN LRG LVL3 (GOWN DISPOSABLE) ×2 IMPLANT
GOWN STRL REIN XL XLG (GOWN DISPOSABLE) ×4 IMPLANT
IV LACTATED RINGER IRRG 3000ML (IV SOLUTION) ×2
IV LR IRRIG 3000ML ARTHROMATIC (IV SOLUTION) ×1 IMPLANT
KIT BASIN OR (CUSTOM PROCEDURE TRAY) ×2 IMPLANT
NS IRRIG 1000ML POUR BTL (IV SOLUTION) ×2 IMPLANT
POUCH SPECIMEN RETRIEVAL 10MM (ENDOMECHANICALS) ×1 IMPLANT
SCISSORS LAP 5X35 DISP (ENDOMECHANICALS) ×2 IMPLANT
SET CHOLANGIOGRAPH MIX (MISCELLANEOUS) IMPLANT
SET IRRIG TUBING LAPAROSCOPIC (IRRIGATION / IRRIGATOR) ×2 IMPLANT
SLEEVE Z-THREAD 5X100MM (TROCAR) ×2 IMPLANT
SOLUTION ANTI FOG 6CC (MISCELLANEOUS) ×2 IMPLANT
SPONGE GAUZE 2X2 STER 10/PKG (GAUZE/BANDAGES/DRESSINGS) ×1
STRIP CLOSURE SKIN 1/2X4 (GAUZE/BANDAGES/DRESSINGS) ×2 IMPLANT
SUT MNCRL AB 4-0 PS2 18 (SUTURE) ×2 IMPLANT
SUT VICRYL 0 TIES 12 18 (SUTURE) ×2 IMPLANT
SUT VICRYL 0 UR6 27IN ABS (SUTURE) ×2 IMPLANT
TOWEL OR 17X26 10 PK STRL BLUE (TOWEL DISPOSABLE) ×6 IMPLANT
TRAY LAP CHOLE (CUSTOM PROCEDURE TRAY) ×2 IMPLANT
TROCAR 5M 150ML BLDLS (TROCAR) ×2 IMPLANT
TROCAR Z-THREAD FIOS 11X100 BL (TROCAR) ×2 IMPLANT
TROCAR Z-THREAD FIOS 5X100MM (TROCAR) ×2 IMPLANT
TUBING INSUFFLATION 10FT LAP (TUBING) ×2 IMPLANT

## 2011-11-17 NOTE — Anesthesia Preprocedure Evaluation (Addendum)
Anesthesia Evaluation  Patient identified by MRN, date of birth, ID band Patient awake    Reviewed: Allergy & Precautions, H&P , NPO status , Patient's Chart, lab work & pertinent test results  Airway Mallampati: II TM Distance: >3 FB Neck ROM: full    Dental  (+) Dental Advisory Given, Caps and Missing,    Pulmonary neg pulmonary ROS,  breath sounds clear to auscultation  Pulmonary exam normal       Cardiovascular Exercise Tolerance: Good hypertension, Pt. on medications negative cardio ROS  Rhythm:regular Rate:Normal     Neuro/Psych negative neurological ROS  negative psych ROS   GI/Hepatic negative GI ROS, Neg liver ROS,   Endo/Other  negative endocrine ROSHypothyroidism   Renal/GU negative Renal ROS  negative genitourinary   Musculoskeletal   Abdominal   Peds  Hematology negative hematology ROS (+)   Anesthesia Other Findings   Reproductive/Obstetrics negative OB ROS                          Anesthesia Physical Anesthesia Plan  ASA: II  Anesthesia Plan: General   Post-op Pain Management:    Induction: Intravenous  Airway Management Planned: Oral ETT  Additional Equipment:   Intra-op Plan:   Post-operative Plan: Extubation in OR  Informed Consent: I have reviewed the patients History and Physical, chart, labs and discussed the procedure including the risks, benefits and alternatives for the proposed anesthesia with the patient or authorized representative who has indicated his/her understanding and acceptance.   Dental Advisory Given  Plan Discussed with: CRNA and Surgeon  Anesthesia Plan Comments:         Anesthesia Quick Evaluation

## 2011-11-17 NOTE — Transfer of Care (Signed)
Immediate Anesthesia Transfer of Care Note  Patient: Christine Shepherd  Procedure(s) Performed: Procedure(s) (LRB): LAPAROSCOPIC CHOLECYSTECTOMY SINGLE PORT (N/A)  Patient Location: PACU  Anesthesia Type: General  Level of Consciousness: sedated, patient cooperative and responds to stimulaton  Airway & Oxygen Therapy: Patient Spontanous Breathing and Patient connected to face mask oxgen  Post-op Assessment: Report given to PACU RN and Post -op Vital signs reviewed and stable  Post vital signs: Reviewed and stable  Complications: No apparent anesthesia complications

## 2011-11-17 NOTE — Discharge Instructions (Signed)

## 2011-11-17 NOTE — Anesthesia Postprocedure Evaluation (Signed)
  Anesthesia Post-op Note  Patient: Christine Shepherd  Procedure(s) Performed: Procedure(s) (LRB): LAPAROSCOPIC CHOLECYSTECTOMY SINGLE PORT (N/A)  Patient Location: PACU  Anesthesia Type: General  Level of Consciousness: awake and alert   Airway and Oxygen Therapy: Patient Spontanous Breathing  Post-op Pain: mild  Post-op Assessment: Post-op Vital signs reviewed, Patient's Cardiovascular Status Stable, Respiratory Function Stable, Patent Airway and No signs of Nausea or vomiting  Post-op Vital Signs: stable  Complications: No apparent anesthesia complications

## 2011-11-17 NOTE — Op Note (Signed)
11/17/2011  4:25 PM  PATIENT:  Christine Shepherd  61 y.o. female  Patient Care Team: Enrique Sack, MD as PCP - General (Internal Medicine) Fernande Bras, PA-C as Physician Assistant (Physician Assistant)  PRE-OPERATIVE DIAGNOSIS:  Cholecystitis  POST-OPERATIVE DIAGNOSIS:  Acute on chronic cholecystitis with gallstones  PROCEDURE:  Procedure(s): LAPAROSCOPIC CHOLECYSTECTOMY SINGLE PORT SUPRAUMBILICAL VWH HERNIA REPAIR  SURGEON:  Surgeon(s): Ardeth Sportsman, MD  ASSISTANT: none   ANESTHESIA:   local and general  EBL:  Total I/O In: 1000 [I.V.:1000] Out: -   Delay start of Pharmacological VTE agent (>24hrs) due to surgical blood loss or risk of bleeding:  no  DRAINS: none   SPECIMEN:  Source of Specimen:  Gallbladder w gallstones  DISPOSITION OF SPECIMEN:  PATHOLOGY  COUNTS:  YES  PLAN OF CARE: Discharge to home after PACU  PATIENT DISPOSITION:  PACU - hemodynamically stable.  INDICATION: Patient is an obese female with classic biliary colic. It has become more constant. I saw HER-2 days ago. I was concerned with constipation she was developing cholecystitis. I recommend she consider surgery. She wished to delay a few days until a more convenient time.  The anatomy & physiology of hepatobiliary & pancreatic function was discussed.  The pathophysiology of gallbladder dysfunction was discussed.  Natural history risks without surgery was discussed.   I feel the risks of no intervention will lead to serious problems that outweigh the operative risks; therefore, I recommended cholecystectomy to remove the pathology.  I explained laparoscopic techniques with possible need for an open approach.  Probable cholangiogram to evaluate the bilary tract was explained as well.    Risks such as bleeding, infection, abscess, leak, injury to other organs, need for further treatment, heart attack, death, and other risks were discussed.  I noted a good likelihood this will help address the  problem.  Possibility that this will not correct all abdominal symptoms was explained.  Goals of post-operative recovery were discussed as well.  We will work to minimize complications.  An educational handout further explaining the pathology and treatment options was given as well.  Questions were answered.  The patient expresses understanding & wishes to proceed with surgery.  OR FINDINGS: She had acute gallbladder wall thickening. She had thin white bile. She had been size hard stones.  She had a very narrowed cystic duct that was fibrotic and I cannot cannulate.  Her liver looked normal. She did have a 1.5 cm supraumbilical ventral hernia. Had some omentum  within it. It was reduced and primarily repaired   DESCRIPTION:   The patient was identified & brought in the operating room. The patient was positioned supine with arms tucked. SCDs were active during the entire case. The patient underwent general anesthesia without any difficulty.   Patient received 3 g of IV Unasyn and given her probable cholecystitis. The abdomen was prepped and draped in a sterile fashion. A Surgical Timeout confirmed our plan.  I made a transverse curvilinear incision through the superior umbilical fold.  I placed a 5mm long port through the supraumbilical fascia using a modified Hassan cutdown technique. I began carbon dioxide insufflation. Camera inspection revealed no injury. There were a few adhesions of omentum to the left upper quadrant. 3 does off using the camera well. There were acute near the umbilical wound. I swept them off. no adhesions to the anterior abdominal wall supraumbilically.  I proceeded to continue with single site technique. I placed a #5 port in left upper  aspect of the wound. I placed a 5 mm atraumatic grasper in the right inferior aspect of the wound.  I turned attention to the right upper quadrant.  The gallbladder was very thickwalled and inflamed. I tried to grasp the gallbladder. It was very  thick. The gallbladder leaked clear white bile. It spilled a single stone. I decompressed the gallbladder. I was able better grasped and elevated cephalad.was elevated cephalad. I freed the peritoneal coverings between the gallbladder and the liver on the posteriolateral and anteriomedial walls. I alternated between Harmonic & blunt Maryland dissection to help get a good critical view of the cystic artery and cystic duct. I did further dissection to free the proximal third of the gallbladder off the liver bed to get a good critical view of the infundibulum and cystic duct. I mobilized the cystic artery; and, after getting a good 360 view, ligated the cystic artery using the Harmonic ultrasonic dissection. I skeletonized the cystic duct.  I could see a soft inflamed lymph node of Calot. I left it in place.   I placed a clip on the infundibulum. I did a partial cystic duct-otomy and ensured patency. I placed a 5 [place] cholangiocatheter through a puncture site at the right subcostal ridge of the abdominal wall and  attempted to direct it into the cystic duct. it would not advance. I did another ductotomy a few millimeters more proximally. I had the same problem. Then the cystic duct avulsed. I aborted cholangiogram.   I removed the cholangiocatheter. I placed  a 0 PDS Endoloop around the cystic stump since it was a few centimeters long and easy to lasso.  I was able to place 3 clips on the cystic duct stump is well for further ligation and identification.I freed the gallbladder from its remaining attachments to the liver. I ensured hemostasis on the gallbladder fossa of the liver and elsewhere. I inspected the rest of the abdomen & detected no injury nor bleeding elsewhere.   I placed the gallbladder in the spilled stone into an Endo Catch bag.  I removed the gallbladder out the supraumbilical fascia.  I had to dilate the fascia 3 cm to get it out as was very thickened and it also had an even larger stone  within the fundus.  I closed the fascia transversely using 0 Vicryl interrupted stitches. I noted a hernia 2 cm cephalad to the supraumbilical wound. I further mobilized the skin flap cephalad to get to and close that primarily as well. I allowed the hernia sac and reduced back into the abdominal cavity. I closed the skin using 4-0 monocryl stitch.  Sterile dressing was applied. The patient was extubated & arrived in the PACU in stable condition..  I had discussed postoperative care with the patient in the holding area. I am about to locate the patient's family and discuss operative findings and postoperative goals / instructions.  Instructions are written in the chart as well.

## 2011-11-17 NOTE — Progress Notes (Signed)
Pt. Having difficulty breathing and talking at intervals. Encouraged cough and deep breathe, humidified air blow by at 28 %   and 02 4l applied

## 2011-11-17 NOTE — H&P (View-Only) (Signed)
Subjective:     Patient ID: Christine Shepherd, female   DOB: 08-Jul-1951, 61 y.o.   MRN: [id number]  HPI  Christine Shepherd  January 12, 1951 [id number]  Patient Care Team: Enrique Sack, MD as PCP - General (Internal Medicine) Fernande Bras, PA-C as Physician Assistant (Physician Assistant)  This patient is a 61 y.o.female who presents today for surgical evaluation at the request of Dr. Lynelle Doctor.   Pleasant middle-aged female who's had episodes of severe upper abdominal pain. Worse on the right. Radiates to the back. She does enjoy fried foods but is headless tolerance for that. She usually tries to take gas pills such as Gas-X. She has mild heartburn for which controls it. This pain was different.  She had a more severe attack earlier in the week. Felt severely nauseated. She vomited. It did not help. She went to the emergency room. Workup showed gallstones. Began to feel a little better. Based on concerns, Dr. Lynelle Doctor with the emergency department sent the patient for surgical evaluation to consider cholecystectomy to relieve her problems.  She was sent to [place] for close followup.  Does not drink alcohol much. No history of hepatitis or pancreatitis. Can walk 30 minutes without difficulty. No pulmonary issues. This thyroid issues which are stable. Probable goiter. No globus or dysphasia. No problems with severely spicy foods.    Patient Active Problem List  Diagnoses  . Gallstones  . Hypertension  . Thyroid disease    Past Medical History  Diagnosis Date  . Hypertension   . Thyroid disease   . Gallstones     Past Surgical History  Procedure Date  . Abdominal hysterectomy   . Dilation and curettage of uterus     History   Social History  . Marital Status: Divorced    Spouse Name: N/A    Number of Children: N/A  . Years of Education: N/A   Occupational History  . Not on file.   Social History Main Topics  . Smoking status: Never Smoker   . Smokeless tobacco: Not on file  .  Alcohol Use: No  . Drug Use: No  . Sexually Active: Not on file   Other Topics Concern  . Not on file   Social History Narrative  . No narrative on file    Family History  Problem Relation Age of Onset  . Cancer Father     Prostate Ca  . Cancer Brother     unknown    Current Outpatient Prescriptions  Medication Sig Dispense Refill  . estradiol (VIVELLE-DOT) 0.05 MG/24HR Place 1 patch onto the skin 2 (two) times a week.      [name] HYDROcodone-acetaminophen (NORCO) 5-325 MG per tablet Take 1-2 tablets by mouth every 6 (six) hours as needed for pain.  16 tablet  0  . ibuprofen (ADVIL,MOTRIN) 200 MG tablet Take 200 mg by mouth every 6 (six) hours as needed. Pain      . levothyroxine (SYNTHROID, LEVOTHROID) 75 MCG tablet Take 75 mcg by mouth daily.      . Multiple Vitamin (MULITIVITAMIN WITH MINERALS) TABS Take 1 tablet by mouth daily.      . ondansetron (ZOFRAN ODT) 8 MG disintegrating tablet Take 1 tablet (8 mg total) by mouth every 8 (eight) hours as needed for nausea.  20 tablet  0  . valsartan-hydrochlorothiazide (DIOVAN-HCT) 160-25 MG per tablet Take 1 tablet by mouth daily.         No Known Allergies  BP 146/78  Pulse 62  Temp(Src) 97.6 F (36.4 C) (Temporal)  Resp 12  Ht 5\' 6"  (1.676 m)  Wt 171 lb (77.565 kg)  BMI 27.60 kg/m2     Review of Systems  Constitutional: Negative for fever, chills, diaphoresis, appetite change and fatigue.  HENT: Negative for ear pain, sore throat, trouble swallowing, neck pain and ear discharge.   Eyes: Negative for photophobia, discharge and visual disturbance.  Respiratory: Negative for cough, choking, chest tightness and shortness of breath.   Cardiovascular: Negative for chest pain and palpitations.  Gastrointestinal: Positive for nausea. Negative for vomiting, abdominal pain, diarrhea, constipation, anal bleeding and rectal pain.  Genitourinary: Negative for dysuria, frequency and difficulty urinating.  Musculoskeletal: Negative for  myalgias and gait problem.  Skin: Negative for color change, pallor and rash.  Neurological: Negative for dizziness, speech difficulty, weakness and numbness.  Hematological: Negative for adenopathy.  Psychiatric/Behavioral: Negative for confusion and agitation. The patient is not nervous/anxious.        Objective:   Physical Exam  Constitutional: She is oriented to person, place, and time. She appears well-developed and well-nourished. No distress.  HENT:  Head: Normocephalic.  Mouth/Throat: Oropharynx is clear and moist. No oropharyngeal exudate.  Eyes: Conjunctivae and EOM are normal. Pupils are equal, round, and reactive to light. No scleral icterus.  Neck: Normal range of motion. Neck supple. No tracheal deviation present.  Cardiovascular: Normal rate, regular rhythm and intact distal pulses.   Pulmonary/Chest: Effort normal and breath sounds normal. No respiratory distress. She exhibits no tenderness.  Abdominal: Soft. Bowel sounds are normal. She exhibits no distension, no pulsatile liver, no abdominal bruit and no mass. There is tenderness in the right upper quadrant. There is positive Murphy's sign. There is no rigidity, no guarding, no CVA tenderness and no tenderness at McBurney's point. No hernia. Hernia confirmed negative in the right inguinal area and confirmed negative in the left inguinal area.       Obese but soft  Genitourinary: No vaginal discharge found.  Musculoskeletal: Normal range of motion. She exhibits no tenderness.  Lymphadenopathy:    She has no cervical adenopathy.       Right: No inguinal adenopathy present.       Left: No inguinal adenopathy present.  Neurological: She is alert and oriented to person, place, and time. No cranial nerve deficit. She exhibits normal muscle tone. Coordination normal.  Skin: Skin is warm and dry. No rash noted. She is not diaphoretic. No erythema.  Psychiatric: She has a normal mood and affect. Her behavior is normal. Judgment  and thought content normal.   US Abdomen Complete  11/13/2011  *RADIOLOGY REPORT*  Clinical Data:  Epigastric abdominal pain.  Nausea and vomiting.  COMPLETE ABDOMINAL ULTRASOUND 11/13/2011:  Comparison:  None.  Findings:  Gallbladder:  Multiple shadowing gallstones, the largest on the order of approximately 13 mm.  No gallbladder wall thickening or pericholecystic fluid.  Negative sonographic Murphy's sign according to the ultrasound technologist.  Common bile duct:  Normal in caliber with maximum diameter approximating 4 mm.  Liver:  Normal size and echotexture without focal parenchymal abnormality.  Patent portal vein with hepatopetal flow.  IVC:  Patent.  Pancreas:  Although the pancreas is difficult to visualize in its entirety, no focal pancreatic abnormality is identified.  Spleen:  Normal size and echotexture without focal parenchymal abnormality.  Right Kidney:  No hydronephrosis.  Extrarenal pelvis.  Well- preserved cortex.  No shadowing calculi.  Normal size and parenchymal echotexture without focal  abnormalities.  Approximately 10.6 cm in length.  Left Kidney:  No hydronephrosis.  Well-preserved cortex.  No shadowing calculi.  Normal size and parenchymal echotexture without focal abnormalities.  Approximately 10.4 cm in length.  Abdominal aorta:  Normal in caliber throughout its visualized course in the abdomen without significant atherosclerosis.  IMPRESSION:  1.  Cholelithiasis without CT evidence of acute cholecystitis. 2.  Otherwise normal examination.  Original Report Authenticated By: Arnell Sieving, M.D.   Dg Abd Acute W/chest  11/13/2011  *RADIOLOGY REPORT*  Clinical Data: Abdominal pain.  Nausea and vomiting.  ACUTE ABDOMEN SERIES (ABDOMEN 2 VIEW & CHEST 1 VIEW) 11/13/2011:  Comparison: None.  Findings: Gas within normal caliber loops of small bowel throughout the abdomen.  Gas and expected amount of stool and normal caliber colon.  Fluid-filled J-shaped stomach.  No free intraperitoneal  air.  No air-fluid levels in the small bowel.  No abnormal calcifications.  Mild degenerative changes involving the lumbar spine.  Cardiomediastinal silhouette unremarkable.   Lungs clear. Bronchovascular markings normal.  Pulmonary vascularity normal.  No pneumothorax.  No pleural effusions.  IMPRESSION: No acute abdominal or pulmonary abnormality.  Original Report Authenticated By: Arnell Sieving, M.D.     Assessment:     Cholecystitis, probable acute on chronic    Plan:     I think this requires surgery soon. She's got pain in her face. I will start her on some oral Augmentin. We'll try to figure in the next day or so. She's not severely toxic to the point that she can tolerate anything, so I think we can try to avoid doing immediately tonight. She is interested in proceeding. I discussed the procedure with her.  It is reasonable to start with a single site technique. If the gallbladder is particularly inflamed or infected, then I may need to convert to a standard four-port  The anatomy & physiology of hepatobiliary & pancreatic function was discussed.  The pathophysiology of gallbladder dysfunction was discussed.  Natural history risks without surgery was discussed.   I feel the risks of no intervention will lead to serious problems that outweigh the operative risks; therefore, I recommended cholecystectomy to remove the pathology.  I explained laparoscopic techniques with possible need for an open approach.  Probable cholangiogram to evaluate the bilary tract was explained as well.    Risks such as bleeding, infection, abscess, leak, injury to other organs, need for further treatment, heart attack, death, and other risks were discussed.  I noted a good likelihood this will help address the problem.  Possibility that this will not correct all abdominal symptoms was explained.  Goals of post-operative recovery were discussed as well.  We will work to minimize complications.  An educational handout  further explaining the pathology and treatment options was given as well.  Questions were answered.  The patient expresses understanding & wishes to proceed with surgery.

## 2011-11-17 NOTE — Interval H&P Note (Signed)
History and Physical Interval Note:  11/17/2011 2:31 PM  Christine Shepherd  has presented today for surgery, with the diagnosis of Cholecystitis  The various methods of treatment have been discussed with the patient and family. After consideration of risks, benefits and other options for treatment, the patient has consented to  Procedure(s) (LRB): LAPAROSCOPIC CHOLECYSTECTOMY SINGLE PORT (N/A) as a surgical intervention .  The patients' history has been reviewed, patient examined, no change in status, stable for surgery.  I have reviewed the patients' chart and labs.  Questions were answered to the patient's satisfaction.     [name] C.

## 2011-11-17 NOTE — Progress Notes (Signed)
Pt. breathing  Without difficulty, vss, 02 sat 100% on room air , transfered to short stay

## 2011-11-25 ENCOUNTER — Encounter (INDEPENDENT_AMBULATORY_CARE_PROVIDER_SITE_OTHER): Payer: Self-pay

## 2011-12-06 ENCOUNTER — Encounter (INDEPENDENT_AMBULATORY_CARE_PROVIDER_SITE_OTHER): Payer: BC Managed Care – PPO | Admitting: Surgery

## 2011-12-13 ENCOUNTER — Ambulatory Visit (INDEPENDENT_AMBULATORY_CARE_PROVIDER_SITE_OTHER): Payer: BC Managed Care – PPO | Admitting: Surgery

## 2011-12-13 ENCOUNTER — Encounter (INDEPENDENT_AMBULATORY_CARE_PROVIDER_SITE_OTHER): Payer: Self-pay | Admitting: Surgery

## 2011-12-13 VITALS — BP 160/80 | HR 89 | Temp 98.2°F | Resp 14 | Ht 66.0 in | Wt 166.6 lb

## 2011-12-13 DIAGNOSIS — K812 Acute cholecystitis with chronic cholecystitis: Secondary | ICD-10-CM

## 2011-12-13 NOTE — Progress Notes (Signed)
Subjective:     Patient ID: Christine Shepherd, female   DOB: 1951/06/20, 61 y.o.   MRN: [id number]  HPI  Christine Shepherd  09-23-1950 [id number]  Patient Care Team: Enrique Sack, MD as PCP - General (Internal Medicine) Fernande Bras, PA-C as Physician Assistant (Physician Assistant)  This patient is a 61 y.o.female who presents today for surgical evaluation.  Procedure: A single site laparoscopic cholecystectomy and supraumbilical ventral wall hernia repair 11/17/2011  Pathology: Acute on chronic cholecystitis. Cholecystolithiasis  The patient comes in today doing well. Soreness down. Eating well. His avoid fried foods. Wants to get back to work. Had some loose stools initially but no daily and more well-formed. No fevers chills or sweats. No nausea or vomiting.   Patient Active Problem List  Diagnoses  . Hypertension  . Thyroid disease    Past Medical History  Diagnosis Date  . Hypertension   . Thyroid disease   . Gallstones   . Acute cholecystitis with chronic cholecystitis s/p lap chole [id number] 11/15/2011    Past Surgical History  Procedure Date  . Abdominal hysterectomy   . Dilation and curettage of uterus   . Tubal ligation   . Cholecystectomy 11/17/11    History   Social History  . Marital Status: Divorced    Spouse Name: N/A    Number of Children: N/A  . Years of Education: N/A   Occupational History  . Not on file.   Social History Main Topics  . Smoking status: Never Smoker   . Smokeless tobacco: Never Used  . Alcohol Use: No  . Drug Use: No  . Sexually Active: Not on file   Other Topics Concern  . Not on file   Social History Narrative  . No narrative on file    Family History  Problem Relation Age of Onset  . Cancer Father     Prostate Ca  . Cancer Brother     unknown    Current Outpatient Prescriptions  Medication Sig Dispense Refill  . estradiol (VIVELLE-DOT) 0.05 MG/24HR Place 1 patch onto the skin 2 (two) times a week.      [name]  ibuprofen (ADVIL,MOTRIN) 200 MG tablet Take 200 mg by mouth every 6 (six) hours as needed. Pain      . levothyroxine (SYNTHROID, LEVOTHROID) 75 MCG tablet Take 75 mcg by mouth daily with breakfast.       . Multiple Vitamin (MULITIVITAMIN WITH MINERALS) TABS Take 1 tablet by mouth daily.      . valsartan-hydrochlorothiazide (DIOVAN-HCT) 160-25 MG per tablet Take 1 tablet by mouth daily with breakfast.          No Known Allergies  BP 160/80  Pulse 89  Temp(Src) 98.2 F (36.8 C) (Temporal)  Resp 14  Ht 5\' 6"  (1.676 m)  Wt 166 lb 9.6 oz (75.569 kg)  BMI 26.89 kg/m2  US Abdomen Complete  11/13/2011  *RADIOLOGY REPORT*  Clinical Data:  Epigastric abdominal pain.  Nausea and vomiting.  COMPLETE ABDOMINAL ULTRASOUND 11/13/2011:  Comparison:  None.  Findings:  Gallbladder:  Multiple shadowing gallstones, the largest on the order of approximately 13 mm.  No gallbladder wall thickening or pericholecystic fluid.  Negative sonographic Murphy's sign according to the ultrasound technologist.  Common bile duct:  Normal in caliber with maximum diameter approximating 4 mm.  Liver:  Normal size and echotexture without focal parenchymal abnormality.  Patent portal vein with hepatopetal flow.  IVC:  Patent.  Pancreas:  Although the pancreas  is difficult to visualize in its entirety, no focal pancreatic abnormality is identified.  Spleen:  Normal size and echotexture without focal parenchymal abnormality.  Right Kidney:  No hydronephrosis.  Extrarenal pelvis.  Well- preserved cortex.  No shadowing calculi.  Normal size and parenchymal echotexture without focal abnormalities.  Approximately 10.6 cm in length.  Left Kidney:  No hydronephrosis.  Well-preserved cortex.  No shadowing calculi.  Normal size and parenchymal echotexture without focal abnormalities.  Approximately 10.4 cm in length.  Abdominal aorta:  Normal in caliber throughout its visualized course in the abdomen without significant atherosclerosis.   IMPRESSION:  1.  Cholelithiasis without CT evidence of acute cholecystitis. 2.  Otherwise normal examination.  Original Report Authenticated By: Arnell Sieving, M.D.     Review of Systems  Constitutional: Negative for fever, chills and diaphoresis.  HENT: Negative for ear pain, sore throat and trouble swallowing.   Eyes: Negative for photophobia and visual disturbance.  Respiratory: Negative for cough and choking.   Cardiovascular: Negative for chest pain and palpitations.  Gastrointestinal: Negative for nausea, vomiting, abdominal pain, diarrhea, constipation, anal bleeding and rectal pain.  Genitourinary: Negative for dysuria, frequency and difficulty urinating.  Musculoskeletal: Negative for myalgias and gait problem.  Skin: Negative for color change, pallor and rash.  Neurological: Negative for dizziness, speech difficulty, weakness and numbness.  Hematological: Negative for adenopathy.  Psychiatric/Behavioral: Negative for confusion and agitation. The patient is not nervous/anxious.        Objective:   Physical Exam  Constitutional: She is oriented to person, place, and time. She appears well-developed and well-nourished. No distress.  HENT:  Head: Normocephalic.  Mouth/Throat: Oropharynx is clear and moist. No oropharyngeal exudate.  Eyes: Conjunctivae and EOM are normal. Pupils are equal, round, and reactive to light. No scleral icterus.  Neck: Normal range of motion. No tracheal deviation present.  Cardiovascular: Normal rate and intact distal pulses.   Pulmonary/Chest: Effort normal. No respiratory distress. She exhibits no tenderness.  Abdominal: Soft. She exhibits no distension and no mass. There is no tenderness. There is no rebound and no guarding. Hernia confirmed negative in the right inguinal area and confirmed negative in the left inguinal area.       Incision clean with normal healing ridge.  No hernias  Genitourinary: No vaginal discharge found.    Musculoskeletal: Normal range of motion. She exhibits no tenderness.  Lymphadenopathy:       Right: No inguinal adenopathy present.       Left: No inguinal adenopathy present.  Neurological: She is alert and oriented to person, place, and time. No cranial nerve deficit. She exhibits normal muscle tone. Coordination normal.  Skin: Skin is warm and dry. No rash noted. She is not diaphoretic.  Psychiatric: She has a normal mood and affect. Her behavior is normal.       Assessment:     s/p lap chole for acute on chronic cholecystitis     Plan:     Increase activity as tolerated.  Do not push through pain.  Advanced on diet as tolerated. Bowel regimen to avoid problems.  Return to clinic p.r.n. The patient expressed understanding and appreciation

## 2012-02-03 ENCOUNTER — Encounter: Payer: Self-pay | Admitting: Internal Medicine

## 2012-03-05 ENCOUNTER — Other Ambulatory Visit (HOSPITAL_COMMUNITY): Payer: Self-pay | Admitting: Internal Medicine

## 2012-03-05 DIAGNOSIS — Z1231 Encounter for screening mammogram for malignant neoplasm of breast: Secondary | ICD-10-CM

## 2012-03-20 ENCOUNTER — Ambulatory Visit (HOSPITAL_COMMUNITY): Payer: BC Managed Care – PPO

## 2012-03-21 ENCOUNTER — Ambulatory Visit (HOSPITAL_COMMUNITY)
Admission: RE | Admit: 2012-03-21 | Discharge: 2012-03-21 | Disposition: A | Discharge status: Home / Self Care | Payer: BC Managed Care – PPO | Source: Ambulatory Visit | Attending: Internal Medicine | Admitting: Internal Medicine

## 2012-03-21 DIAGNOSIS — Z1231 Encounter for screening mammogram for malignant neoplasm of breast: Secondary | ICD-10-CM | POA: Insufficient documentation

## 2012-07-11 DIAGNOSIS — C801 Malignant (primary) neoplasm, unspecified: Secondary | ICD-10-CM

## 2012-07-11 HISTORY — DX: Malignant (primary) neoplasm, unspecified: C80.1

## 2012-07-11 HISTORY — PX: COLON SURGERY: SHX602

## 2013-03-08 ENCOUNTER — Ambulatory Visit: Payer: BC Managed Care – PPO

## 2013-03-08 ENCOUNTER — Ambulatory Visit (INDEPENDENT_AMBULATORY_CARE_PROVIDER_SITE_OTHER): Payer: BC Managed Care – PPO | Admitting: Emergency Medicine

## 2013-03-08 VITALS — BP 140/90 | HR 78 | Temp 98.8°F | Resp 18 | Ht 64.5 in | Wt 171.0 lb

## 2013-03-08 DIAGNOSIS — IMO0002 Reserved for concepts with insufficient information to code with codable children: Secondary | ICD-10-CM

## 2013-03-08 DIAGNOSIS — S39012A Strain of muscle, fascia and tendon of lower back, initial encounter: Secondary | ICD-10-CM

## 2013-03-08 DIAGNOSIS — M549 Dorsalgia, unspecified: Secondary | ICD-10-CM

## 2013-03-08 MED ORDER — CYCLOBENZAPRINE HCL 10 MG PO TABS: ORAL_TABLET | ORAL | Status: DC

## 2013-03-08 MED ORDER — MELOXICAM 15 MG PO TABS: 15.0000 mg | ORAL_TABLET | Freq: Every day | ORAL | Status: DC

## 2013-03-08 NOTE — Progress Notes (Signed)
  Subjective:    Patient ID: Christine Shepherd, female    DOB: [birth year], 62 y.o.   MRN: [id number]  HPI Pt presents with right lower back pain x 2 days. She bends down a lot at work, doesn't know if that's what caused the pain. Not causing difficulty sleeping at night. It hurts her to get up. She can feel the pain when she lifts her right foot up. She has been taking advil and using a heating pad. She says she feels like it is in the muscle. Pain with getting up after sitting for a while.    Review of Systems     Objective:   Physical Exam tenderness at L5-S1 on the right. There is also some tenderness adjacent to L4. Straight leg raising is negative in both legs. Motor strength is 5 out of 5 all muscle groups. Sensory is intact to  [facility] reading (PRIMARY) by  Dr. Cleta Alberts is degenerative changes at L3-L4. There is facet arthropathy at L4-L5 and L5-S1        Assessment & Plan:  We'll treat with meloxicam and Flexeril as well as back exercises .

## 2013-03-08 NOTE — Patient Instructions (Addendum)

## 2013-04-02 ENCOUNTER — Other Ambulatory Visit: Payer: Self-pay

## 2013-04-02 DIAGNOSIS — Z1231 Encounter for screening mammogram for malignant neoplasm of breast: Secondary | ICD-10-CM

## 2013-04-22 ENCOUNTER — Ambulatory Visit
Admission: RE | Admit: 2013-04-22 | Discharge: 2013-04-22 | Disposition: A | Discharge status: Home / Self Care | Payer: BC Managed Care – PPO | Source: Ambulatory Visit

## 2013-04-22 DIAGNOSIS — Z1231 Encounter for screening mammogram for malignant neoplasm of breast: Secondary | ICD-10-CM

## 2013-05-08 ENCOUNTER — Encounter: Payer: Self-pay | Admitting: Internal Medicine

## 2013-06-17 ENCOUNTER — Ambulatory Visit (AMBULATORY_SURGERY_CENTER): Payer: Self-pay

## 2013-06-17 VITALS — Ht 64.5 in | Wt 174.0 lb

## 2013-06-17 DIAGNOSIS — Z860100 Personal history of colon polyps, unspecified: Secondary | ICD-10-CM

## 2013-06-17 MED ORDER — MOVIPREP 100 G PO SOLR: 1.0000 | Freq: Once | ORAL | Status: DC

## 2013-06-27 DIAGNOSIS — T8859XA Other complications of anesthesia, initial encounter: Secondary | ICD-10-CM

## 2013-06-27 HISTORY — DX: Other complications of anesthesia, initial encounter: T88.59XA

## 2013-06-28 ENCOUNTER — Encounter: Payer: Self-pay | Admitting: Internal Medicine

## 2013-06-28 ENCOUNTER — Ambulatory Visit (AMBULATORY_SURGERY_CENTER): Payer: BC Managed Care – PPO | Admitting: Internal Medicine

## 2013-06-28 ENCOUNTER — Encounter: Payer: Self-pay | Admitting: *Deleted

## 2013-06-28 VITALS — BP 133/65 | HR 76 | Temp 98.4°F | Resp 34 | Ht 64.5 in | Wt 174.0 lb

## 2013-06-28 DIAGNOSIS — Z86010 Personal history of colonic polyps: Secondary | ICD-10-CM

## 2013-06-28 DIAGNOSIS — Z1211 Encounter for screening for malignant neoplasm of colon: Secondary | ICD-10-CM

## 2013-06-28 DIAGNOSIS — D126 Benign neoplasm of colon, unspecified: Secondary | ICD-10-CM

## 2013-06-28 MED ORDER — SODIUM CHLORIDE 0.9 % IV SOLN: 500.0000 mL | INTRAVENOUS | Status: DC

## 2013-06-28 NOTE — Progress Notes (Signed)
Called to room to assist during endoscopic procedure.  Patient ID and intended procedure confirmed with present staff. Received instructions for my participation in the procedure from the performing physician.  

## 2013-06-28 NOTE — Progress Notes (Signed)
Reviewed discharge instructions with patient and husband Fayrene Fearing.  Answered all questions and concern.  Patient and husband Fayrene Fearing verified understanding of instructions.

## 2013-06-28 NOTE — Progress Notes (Signed)
Pe MD, get patient up and walking around.  Patient went to bathroom and void. Scant amount on peripad.  Light bleeding in toilet, thin clear pink in color.  Patient walked around PACU for several laps with no problems.  Place sitting up in chair in PACU bay 5 at 1730.

## 2013-06-28 NOTE — Op Note (Addendum)
[facility] [address] [place], [address]   COLONOSCOPY PROCEDURE REPORT  PATIENT: Christine, Shepherd.  MR#: [id number] BIRTHDATE: 1951-02-14 , 62  yrs. old GENDER: Female ENDOSCOPIST: Hart Carwin, MD REFERRED XB:[id number] Chilton Si, M.D. PROCEDURE DATE:  06/28/2013 PROCEDURE:   Colonoscopy with snare polypectomy and Colonoscopy with cold biopsy polypectomy First Screening Colonoscopy - Avg.  risk and is 50 yrs.  old or older - No.  Prior Negative Screening - Now for repeat screening. N/A  History of Adenoma - Now for follow-up colonoscopy & has been > or = to 3 yrs.  N/A  Polyps Removed Today? Yes. ASA CLASS:   Class II INDICATIONS:Average risk patient for colon cancer..last colonoscopy in June 2004 showed polyp which consisted of polypoid tissue MEDICATIONS: MAC sedation, administered by CRNA and propofol (Diprivan) 500mg  IV  DESCRIPTION OF PROCEDURE:   After the risks benefits and alternatives of the procedure were thoroughly explained, informed consent was obtained.  A digital rectal exam revealed no abnormalities of the rectum.   The LB [id number]  endoscope was introduced through the anus and advanced to the cecum, which was identified by both the appendix and ileocecal valve. No adverse events experienced.   The quality of the prep was good, using MoviPrep  The instrument was then slowly withdrawn as the colon was fully examined.      COLON FINDINGS: Three  semi-pedunculated polyps were found in the sigmoid colon.as follows:,1 small 5 mm sessile polyp was removed with cold snare from 50 cm,, second polyp at 25 cm was a large fungating polypoid multilobulated .It was resected piecemeal  with the hot snare , about 4 large pieces were recovered but the base of the [polyp could nlot be removed entirely due to ulcerated spread nature. Biopsies were taken from the ulcerated friable  base  which appeared suspicious for carcinoma.   The biopsy site  started  to bleed like a small  arterial pumper .The site was injected  with Epinephrine  total of 6 cc and closed with endoclips x2,  Complete cessation of  bleeding  was achieved.. Another polyp was seen at 20 cm but was not removed because of complicated the polypectomy of the giant polyp.     Retroflexed views revealed no abnormalities. The time to cecum=9 minutes 59 seconds.  Withdrawal time=28 minutes 31 seconds.  The scope was withdrawn and the procedure completed. COMPLICATIONS: There were no complications.  ENDOSCOPIC IMPRESSION: 1. Giant  semi-pedunculated polyps was  found in the sigmoid colon at 25 cm; polypectomy was performed with cold forceps, with a cold snare and using snare cautery ,polyp partially removed piecemeal leaving the friable  base  which appeared suspicious for malignancy.Arterial  bleeder from biopsy site injected.with epinephrine x6 cc and placement of endoclips 2.  5 mm polyp at 20 cm not removed 3. 5 mm polyp at 50 cm removed with cold snare  RECOMMENDATIONS: 1.  Await pathology results, Path sent [facility] 2.  observe for signs of bleeding or perforation. A she will stay on liquids today and will report any excess bleeding or pain Repeat sigmoidoscopy in 6-8 weeks pending results of the path report , polyp which may require surgical resection.depending on the pathology report patient may need surgical opinion and or CT scan of the abdomen.   eSigned:  Hart Carwin, MD 07/01/2013 12:48 PM Revised: 07/01/2013 12:48 PM  cc:   PATIENT NAME:  Christine, Shepherd. MR#: [id number]

## 2013-06-28 NOTE — Patient Instructions (Addendum)
Reviewed discharge instructions with patient /caregiver.  Remain on Clear liquid diet for the remainder of the day. Clear liquid diet information sheet given.  Do not use aspirin or aspirin products until Jul 12, 2013.  Patient to stay out of work on Saturday, Dec 20.  Excused Note for work given to patient/caregiver.  Patient instructed to call the on-call MD by 10 AM Saturday, Dec 20 to let MD know how you are doing.  On Call Phone Number [phone].   YOU HAD AN ENDOSCOPIC PROCEDURE TODAY AT THE [facility]: Refer to the procedure report that was given to you for any specific questions about what was found during the examination.  If the procedure report does not answer your questions, please call your gastroenterologist to clarify.  If you requested that your care partner not be given the details of your procedure findings, then the procedure report has been included in a sealed envelope for you to review at your convenience later.  YOU SHOULD EXPECT: Some feelings of bloating in the abdomen. Passage of more gas than usual.  Walking can help get rid of the air that was put into your GI tract during the procedure and reduce the bloating. If you had a lower endoscopy (such as a colonoscopy or flexible sigmoidoscopy) you may notice spotting of blood in your stool or on the toilet paper. If you underwent a bowel prep for your procedure, then you may not have a normal bowel movement for a few days.  DIET: Clear liquid diet for the remainder of today, Dec 19.  ACTIVITY: Your care partner should take you home directly after the procedure.  You should plan to take it easy, moving slowly for the rest of the day.  You can resume normal activity the day after the procedure however you should NOT DRIVE or use heavy machinery for 24 hours (because of the sedation medicines used during the test).    SYMPTOMS TO REPORT IMMEDIATELY: A gastroenterologist can be reached at any hour.  During normal  business hours, 8:30 AM to 5:00 PM Monday through Friday, call [phone].  After hours and on weekends, please call the GI answering service at [phone] who will take a message and have the physician on call contact you.   Following lower endoscopy (colonoscopy or flexible sigmoidoscopy):  Excessive amounts of blood in the stool  Significant tenderness or worsening of abdominal pains  Swelling of the abdomen that is new, acute  Fever of 100F or higher  FOLLOW UP: If any biopsies were taken you will be contacted by phone or by letter within the next 1-3 weeks.  Call your gastroenterologist if you have not heard about the biopsies in 3 weeks.  Our staff will call the home number listed on your records the next business day following your procedure to check on you and address any questions or concerns that you may have at that time regarding the information given to you following your procedure. This is a courtesy call and so if there is no answer at the home number and we have not heard from you through the emergency physician on call, we will assume that you have returned to your regular daily activities without incident.  SIGNATURES/CONFIDENTIALITY: You and/or your care partner have signed paperwork which will be entered into your electronic medical record.  These signatures attest to the fact that that the information above on your After Visit Summary has been reviewed and is understood.  Full responsibility of the confidentiality of this discharge information lies with you and/or your care-partner.

## 2013-06-28 NOTE — Progress Notes (Signed)
Report to pacu rn, vss, bbs=clear 

## 2013-06-28 NOTE — Progress Notes (Signed)
Per MD, monitoring patient's rectal bleeding.  Replace chuck on bed with clean one and placed maxi pad between legs.  Patient's rectal bleeding is scant amount. Will continue to monitor.

## 2013-07-01 ENCOUNTER — Telehealth: Payer: Self-pay | Admitting: *Deleted

## 2013-07-01 ENCOUNTER — Encounter: Payer: Self-pay | Admitting: *Deleted

## 2013-07-01 DIAGNOSIS — C187 Malignant neoplasm of sigmoid colon: Secondary | ICD-10-CM

## 2013-07-01 NOTE — Telephone Encounter (Signed)
Scheduled CT of abdomen/pelvis at [facility] radiology on 07/03/13 at 10:30 AM.  Contrast two and one hour prior to CT. Patient has seen Dr. Michaell Cowing in past, per Dr. Juanda Chance ok to schedule with him. Scheduled at [facility] on 07/17/13 at 11:00 /11:30 AM. Patient aware. She will pick up contrast tomorrow when she gets labs.

## 2013-07-01 NOTE — Telephone Encounter (Signed)
  Follow up Call-  Call back number 06/28/2013  Post procedure Call Back phone  # [phone]  Permission to leave phone message Yes     Patient questions:  Do you have a fever, pain , or abdominal swelling? no Pain Score  0 *  Have you tolerated food without any problems? yes  Have you been able to return to your normal activities? yes  Do you have any questions about your discharge instructions: Diet   no Medications  no Follow up visit  no  Do you have questions or concerns about your Care? no  Actions: * If pain score is 4 or above: No action needed, pain <4.

## 2013-07-01 NOTE — Telephone Encounter (Signed)
Christine Shepherd, I have spoken to [name]. 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 Please order CT scan of the abdomen and pelvis " colon cancer sigmoid colon", she will come tomorrow at 3.30 pm for following tests:CBC, c-met, CEA,TSH. Please refer to Dr Maryruth Eve for surgical consultation, Dr Rund,pathologist placed her name on tumor board.

## 2013-07-02 ENCOUNTER — Other Ambulatory Visit: Payer: Self-pay | Admitting: *Deleted

## 2013-07-02 ENCOUNTER — Ambulatory Visit: Payer: BC Managed Care – PPO

## 2013-07-02 ENCOUNTER — Encounter: Payer: Self-pay | Admitting: Internal Medicine

## 2013-07-02 DIAGNOSIS — C187 Malignant neoplasm of sigmoid colon: Secondary | ICD-10-CM

## 2013-07-02 DIAGNOSIS — C189 Malignant neoplasm of colon, unspecified: Secondary | ICD-10-CM

## 2013-07-02 LAB — CBC WITH DIFFERENTIAL/PLATELET
Basophils Absolute: 0 10*3/uL (ref 0.0–0.1)
Basophils Relative: 0.3 % (ref 0.0–3.0)
Eosinophils Absolute: 0 10*3/uL (ref 0.0–0.7)
Eosinophils Relative: 0.6 % (ref 0.0–5.0)
HCT: 37.7 % (ref 36.0–46.0)
Lymphs Abs: 1.9 10*3/uL (ref 0.7–4.0)
MCHC: 33.6 g/dL (ref 30.0–36.0)
MCV: 88.9 fl (ref 78.0–100.0)
Monocytes Absolute: 0.5 10*3/uL (ref 0.1–1.0)
Platelets: 197 10*3/uL (ref 150.0–400.0)
WBC: 6 10*3/uL (ref 4.5–10.5)

## 2013-07-02 LAB — COMPREHENSIVE METABOLIC PANEL
ALT: 14 U/L (ref 0–35)
Albumin: 4.2 g/dL (ref 3.5–5.2)
Alkaline Phosphatase: 42 U/L (ref 39–117)
BUN: 12 mg/dL (ref 6–23)
CO2: 26 mEq/L (ref 19–32)
Calcium: 9.4 mg/dL (ref 8.4–10.5)
GFR: 78.4 mL/min (ref >60.00)
Glucose, Bld: 104 mg/dL — ABNORMAL HIGH (ref 70–99)
Potassium: 3.1 mEq/L — ABNORMAL LOW (ref 3.5–5.1)
Sodium: 139 mEq/L (ref 135–145)
Total Protein: 7.3 g/dL (ref 6.0–8.3)

## 2013-07-02 LAB — TSH: TSH: 0.85 u[IU]/mL (ref 0.35–5.50)

## 2013-07-03 ENCOUNTER — Encounter (HOSPITAL_COMMUNITY): Payer: Self-pay

## 2013-07-03 ENCOUNTER — Ambulatory Visit (HOSPITAL_COMMUNITY)
Admission: RE | Admit: 2013-07-03 | Discharge: 2013-07-03 | Disposition: A | Discharge status: Home / Self Care | Payer: BC Managed Care – PPO | Source: Ambulatory Visit | Attending: Internal Medicine | Admitting: Internal Medicine

## 2013-07-03 DIAGNOSIS — K439 Ventral hernia without obstruction or gangrene: Secondary | ICD-10-CM | POA: Insufficient documentation

## 2013-07-03 DIAGNOSIS — I700 Atherosclerosis of aorta: Secondary | ICD-10-CM | POA: Insufficient documentation

## 2013-07-03 DIAGNOSIS — M47817 Spondylosis without myelopathy or radiculopathy, lumbosacral region: Secondary | ICD-10-CM | POA: Insufficient documentation

## 2013-07-03 DIAGNOSIS — K6389 Other specified diseases of intestine: Secondary | ICD-10-CM | POA: Insufficient documentation

## 2013-07-03 DIAGNOSIS — C187 Malignant neoplasm of sigmoid colon: Secondary | ICD-10-CM

## 2013-07-03 DIAGNOSIS — C189 Malignant neoplasm of colon, unspecified: Secondary | ICD-10-CM | POA: Insufficient documentation

## 2013-07-03 MED ORDER — IOHEXOL 300 MG/ML  SOLN
100.0000 mL | Freq: Once | INTRAMUSCULAR | Status: AC | PRN
  Administered 2013-07-03: 100 mL via INTRAVENOUS

## 2013-07-08 ENCOUNTER — Encounter: Payer: Self-pay | Admitting: Internal Medicine

## 2013-07-08 ENCOUNTER — Ambulatory Visit (INDEPENDENT_AMBULATORY_CARE_PROVIDER_SITE_OTHER): Payer: BC Managed Care – PPO | Admitting: Physician Assistant

## 2013-07-08 VITALS — BP 170/86 | HR 88 | Temp 99.2°F | Resp 16 | Ht 64.75 in | Wt 171.8 lb

## 2013-07-08 DIAGNOSIS — R05 Cough: Secondary | ICD-10-CM

## 2013-07-08 DIAGNOSIS — J40 Bronchitis, not specified as acute or chronic: Secondary | ICD-10-CM

## 2013-07-08 DIAGNOSIS — R509 Fever, unspecified: Secondary | ICD-10-CM

## 2013-07-08 LAB — POCT INFLUENZA A/B
Influenza A, POC: NEGATIVE
Influenza B, POC: NEGATIVE

## 2013-07-08 MED ORDER — HYDROCODONE-HOMATROPINE 5-1.5 MG/5ML PO SYRP: ORAL_SOLUTION | ORAL | Status: DC

## 2013-07-08 MED ORDER — AZITHROMYCIN 250 MG PO TABS: ORAL_TABLET | ORAL | Status: DC

## 2013-07-08 NOTE — Progress Notes (Signed)
Subjective:    Patient ID: Christine Shepherd, female    DOB: [birth year], 62 y.o.   MRN: [id number]  HPI 62 year old female presents with a 3 day history of nasal congestion, post nasal drip, sore throat, and cough. Symptoms began with a scratchy throat. Mild sinus pressure. Fever and chills. T max 100.2. Nasal congestion thick and green/yellow. Cough is sometimes productive of green/yellow sputum and not associated with time of day. No SOB or wheezing. Normal hearing. Has tried OTC cold preps without success. Has been drinking increased apple juice and feels like this caused her to have an episode of diarrhea. Appetite decreased. Multiple sick contacts at work. No recent antibiotics or recent travels.   No leg trauma, sedentary periods, or tobacco use.  Past Medical History  Diagnosis Date  . Hypertension   . Thyroid disease   . Gallstones   . Acute cholecystitis with chronic cholecystitis s/p lap chole [id number] 11/15/2011    Home Meds: Prior to Admission medications   Medication Sig Start Date End Date Taking? Authorizing Provider  estradiol (VIVELLE-DOT) 0.05 MG/24HR Place 1 patch onto the skin 2 (two) times a week.   Yes Historical Provider, MD  ibuprofen (ADVIL,MOTRIN) 200 MG tablet Take 200 mg by mouth every 6 (six) hours as needed. Pain   Yes Historical Provider, MD  levothyroxine (SYNTHROID, LEVOTHROID) 75 MCG tablet Take 75 mcg by mouth daily with breakfast.    Yes Historical Provider, MD  Multiple Vitamin (MULITIVITAMIN WITH MINERALS) TABS Take 1 tablet by mouth daily.   Yes Historical Provider, MD  valACYclovir (VALTREX) 1000 MG tablet  05/24/13  Yes Historical Provider, MD  valsartan-hydrochlorothiazide (DIOVAN-HCT) 160-12.5 MG per tablet Take 1 tablet by mouth daily.   Yes Historical Provider, MD    Allergies: No Known Allergies  History   Social History  . Marital Status: Divorced    Spouse Name: N/A    Number of Children: N/A  . Years of Education: N/A   Occupational  History  . Not on file.   Social History Main Topics  . Smoking status: Never Smoker   . Smokeless tobacco: Never Used  . Alcohol Use: No  . Drug Use: No  . Sexual Activity: Not on file   Other Topics Concern  . Not on file   Social History Narrative  . No narrative on file     Review of Systems  Constitutional: Positive for fever, chills, appetite change and fatigue.  HENT: Positive for congestion, postnasal drip, rhinorrhea, sinus pressure and sore throat. Negative for ear discharge, ear pain, hearing loss and sneezing.   Respiratory: Negative for shortness of breath and wheezing.   Gastrointestinal: Positive for diarrhea. Negative for nausea and vomiting.  Neurological: Positive for headaches.       Objective:   Physical Exam Physical Exam: Blood pressure 170/86, pulse 88, temperature 99.2 F (37.3 C), temperature source Oral, resp. rate 16, height 5' 4.75" (1.645 m), weight 171 lb 12.8 oz (77.928 kg), SpO2 95.00%., Body mass index is 28.8 kg/(m^2). General: Well developed, well nourished, in no acute distress. Head: Normocephalic, atraumatic, eyes without discharge, sclera non-icteric, nares are congested. Bilateral auditory canals clear, TM's are without perforation, pearly grey with reflective cone of light bilaterally. No sinus TTP. Oral cavity moist, dentition normal. Posterior pharynx with post nasal drip and mild erythema. No peritonsillar abscess or tonsillar exudate. Uvula midline.  Neck: Supple. No thyromegaly. Full ROM. No lymphadenopathy. Lungs: Coarse breath sounds bilaterally without wheezes, rales,  or rhonchi. Breathing is unlabored.  Heart: RRR with S1 S2. No murmurs, rubs, or gallops appreciated. Msk:  Strength and tone normal for age. Extremities: No clubbing or cyanosis. No edema. Neuro: Alert and oriented X 3. Moves all extremities spontaneously. CNII-XII grossly in tact. Psych:  Responds to questions appropriately with a normal affect.     Labs: Results for orders placed in visit on 07/08/13  POCT INFLUENZA A/B      Result Value Range   Influenza A, POC Negative     Influenza B, POC Negative          Assessment & Plan:  62 year old female with bronchitis and cough -Azithromycin 250 MG #6 2 po first day then 1 po next 4 days no RF -Hycodan #4oz 1 tsp po q 4-6 hours prn cough no RF SED -Mucinex -Rest/fluids -Avoid OTC products that will elevate BP -RTC precautions  Eula Listen, PA-C [facility] [address] [place], [place] [address] [phone]

## 2013-07-17 ENCOUNTER — Other Ambulatory Visit: Payer: Self-pay | Admitting: *Deleted

## 2013-07-17 ENCOUNTER — Encounter (INDEPENDENT_AMBULATORY_CARE_PROVIDER_SITE_OTHER): Payer: Self-pay

## 2013-07-17 ENCOUNTER — Telehealth (INDEPENDENT_AMBULATORY_CARE_PROVIDER_SITE_OTHER): Payer: Self-pay

## 2013-07-17 ENCOUNTER — Encounter (INDEPENDENT_AMBULATORY_CARE_PROVIDER_SITE_OTHER): Payer: Self-pay | Admitting: Surgery

## 2013-07-17 ENCOUNTER — Encounter: Payer: Self-pay | Admitting: *Deleted

## 2013-07-17 ENCOUNTER — Ambulatory Visit (INDEPENDENT_AMBULATORY_CARE_PROVIDER_SITE_OTHER): Payer: BC Managed Care – PPO | Admitting: Surgery

## 2013-07-17 ENCOUNTER — Telehealth: Payer: Self-pay | Admitting: *Deleted

## 2013-07-17 VITALS — BP 166/90 | HR 96 | Temp 98.9°F | Resp 15 | Ht 66.0 in | Wt 168.6 lb

## 2013-07-17 DIAGNOSIS — C187 Malignant neoplasm of sigmoid colon: Secondary | ICD-10-CM

## 2013-07-17 DIAGNOSIS — C189 Malignant neoplasm of colon, unspecified: Secondary | ICD-10-CM

## 2013-07-17 MED ORDER — NEOMYCIN SULFATE 500 MG PO TABS: 1000.0000 mg | ORAL_TABLET | ORAL | Status: DC

## 2013-07-17 MED ORDER — METRONIDAZOLE 500 MG PO TABS: 500.0000 mg | ORAL_TABLET | ORAL | Status: DC

## 2013-07-17 NOTE — Telephone Encounter (Signed)
Per Dr. Olevia Perches, needs flex sig with tattooing ASAP. Prep with fleets enema x2. Scheduled on 07/22/13 at 10:00 AM at [facility] endo(Jackie).

## 2013-07-17 NOTE — Patient Instructions (Signed)
Please call your gastroenterologist to have a sigmoidoscopy to have the scar/remaining cancer tattooed.  He will require surgery to remove the part of the colon that contained the cancer.  You are good candidate for a minimally invasive approach using the robot our standard laparoscopic techniques.  Colorectal Cancer Colorectal cancer is an abnormal growth of tissue (tumor) in the colon or rectum that is cancerous (malignant). Unlike noncancerous (benign) tumors, malignant tumors can spread to other parts of your body. The colon is the large bowel or large intestine. The rectum is the last several inches of the colon.  RISK FACTORS The exact cause of colorectal cancer is unknown. However, the following factors may increase your chances of getting colorectal cancer:   Age older than 73 years.   Abnormal growths (polyps) on the inner wall of the colon or rectum.   Diabetes.   African American race.   Family history of hereditary nonpolyposis colorectal cancer. This condition is caused by changes in the genes that are responsible for repairing mismatched DNA.   Personal history of cancer. A person who has already had colorectal cancer may develop it a second time. Also, women with a history of ovarian, uterine, or breast cancer are at a somewhat higher risk of developing colorectal cancer.  Certain hereditary conditions.  Eating a diet that is high in fat (especially animal fat) and low in fiber, fruits, and vegetables.  Sedentary lifestyle.  Inflammatory bowel disease, including ulcerative colitis and Crohn disease.   Smoking.   Excessive alcohol use.  SYMPTOMS Early colorectal cancer often does not cause symptoms. As the cancer grows, symptoms may include:   Changes in bowel habits.  Diarrhea.   Constipation.   Feeling like the bowel does not empty completely after a bowel movement.   Blood in the stool.   Stools that are narrower than usual.   Abdominal  discomfort, pain, bloating, fullness, or cramps.  Frequent gas pain.   Unexplained weight loss.   Constant tiredness.   Nausea and vomiting.  DIAGNOSIS  Your health care provider will ask about your medical history. He or she may also perform a number of procedures, such as:   A physical exam.  A digital rectal exam.  A fecal occult blood test.  A barium enema.  Blood tests.   X-rays.   Imaging tests, such as CT scans or MRIs.   Taking a tissue sample (biopsy) from your colon or rectum to look for cancer cells.   A sigmoidoscopy to view the inside of the last part of your colon.   A colonoscopy to view the inside of your entire colon.   An endorectal ultrasound to see how deep a rectal tumor has grown and whether the cancer has spread to lymph nodes or other nearby tissues.  Your cancer will be staged to determine its severity and extent. Staging is a careful attempt to find out the size of the tumor, whether the cancer has spread, and if so, to what parts of the body. You may need to have more tests to determine the stage of your cancer. The test results will help determine what treatment plan is best for you.   Stage 0 The cancer is found only in the innermost lining of the colon or rectum.   Stage I The cancer has grown into the inner wall of the colon or rectum. The cancer has not yet reached the outer wall of the colon.   Stage II The cancer extends more  deeply into or through the wall of the colon or rectum. It may have invaded nearby tissue, but cancer cells have not spread to the lymph nodes.   Stage III The cancer has spread to nearby lymph nodes but not to other parts of the body.   Stage IV The cancer has spread to other parts of the body, such as the liver or lungs.  Your health care provider may tell you the detailed stage of your cancer, which includes both a number and a letter.  TREATMENT  Depending on the type and stage, colorectal  cancer may be treated with surgery, radiation therapy, chemotherapy, targeted therapy, or radiofrequency ablation. Some people have a combination of these therapies. Surgery may be done to remove the polyps from your colon. In early stages, your health care provider may be able to do this during a colonoscopy. In later stages, surgery may be done to remove part of your colon.  HOME CARE INSTRUCTIONS   Only take over-the-counter or prescription medicines for pain, discomfort, or fever as directed by your health care provider.   Maintain a healthy diet.   Consider joining a support group. This may help you learn to cope with the stress of having colorectal cancer.   Seek advice to help you manage treatment of side effects.   Keep all follow-up appointments as directed by your health care provider.   Inform your cancer specialist if you are admitted to the hospital.  SEEK MEDICAL CARE IF:  Your diarrhea or constipation does not go away.   Your bowel habits change.  You have increased abdominal pain.   You notice new fatigue or weakness.  You lose weight. Document Released: 06/27/2005 Document Revised: 02/27/2013 Document Reviewed: 12/20/2012 [facility] Patient Information 2014 [place].  ABDOMINAL SURGERY: POST OP INSTRUCTIONS  1. DIET: Follow a light bland diet the first 24 hours after arrival home, such as soup, liquids, crackers, etc.  Be sure to include lots of fluids daily.  Avoid fast food or heavy meals as your are more likely to get nauseated.  Eat a low fat the next few days after surgery.   2. Take your usually prescribed home medications unless otherwise directed. 3. PAIN CONTROL: a. Pain is best controlled by a usual combination of three different methods TOGETHER: i. Ice/Heat ii. Over the counter pain medication iii. Prescription pain medication b. Most patients will experience some swelling and bruising around the incisions.  Ice packs or heating pads  (30-60 minutes up to 6 times a day) will help. Use ice for the first few days to help decrease swelling and bruising, then switch to heat to help relax tight/sore spots and speed recovery.  Some people prefer to use ice alone, heat alone, alternating between ice & heat.  Experiment to what works for you.  Swelling and bruising can take several weeks to resolve.   c. It is helpful to take an over-the-counter pain medication regularly for the first few weeks.  Choose one of the following that works best for you: i. Naproxen (Aleve, etc)  Two 220mg  tabs twice a day ii. Ibuprofen (Advil, etc) Three 200mg  tabs four times a day (every meal & bedtime) iii. Acetaminophen (Tylenol, etc) 500-650mg  four times a day (every meal & bedtime) d. A  prescription for pain medication (such as oxycodone, hydrocodone, etc) should be given to you upon discharge.  Take your pain medication as prescribed.  i. If you are having problems/concerns with the prescription medicine (does not  control pain, nausea, vomiting, rash, itching, etc), please call us [phone] to see if we need to switch you to a different pain medicine that will work better for you and/or control your side effect better. ii. If you need a refill on your pain medication, please contact your pharmacy.  They will contact our office to request authorization. Prescriptions will not be filled after 5 pm or on week-ends. 4. Avoid getting constipated.  Between the surgery and the pain medications, it is common to experience some constipation.  Increasing fluid intake and taking a fiber supplement (such as Metamucil, Citrucel, FiberCon, MiraLax, etc) 1-2 times a day regularly will usually help prevent this problem from occurring.  A mild laxative (prune juice, Milk of Magnesia, MiraLax, etc) should be taken according to package directions if there are no bowel movements after 48 hours.   5. Watch out for diarrhea.  If you have many loose bowel movements, simplify  your diet to bland foods & liquids for a few days.  Stop any stool softeners and decrease your fiber supplement.  Switching to mild anti-diarrheal medications (Kayopectate, Pepto Bismol) can help.  If this worsens or does not improve, please call us. 6. Wash / shower every day.  You may shower over the incision / wound.  Avoid baths until the skin is fully healed.  Continue to shower over incision(s) after the dressing is off. 7. Remove your waterproof bandages 5 days after surgery.  You may leave the incision open to air.  You may replace a dressing/Band-Aid to cover the incision for comfort if you wish. 8. ACTIVITIES as tolerated:   a. You may resume regular (light) daily activities beginning the next day-such as daily self-care, walking, climbing stairs-gradually increasing activities as tolerated.  If you can walk 30 minutes without difficulty, it is safe to try more intense activity such as jogging, treadmill, bicycling, low-impact aerobics, swimming, etc. b. Save the most intensive and strenuous activity for last such as sit-ups, heavy lifting, contact sports, etc  Refrain from any heavy lifting or straining until you are off narcotics for pain control.   c. DO NOT PUSH THROUGH PAIN.  Let pain be your guide: If it hurts to do something, don't do it.  Pain is your body warning you to avoid that activity for another week until the pain goes down. d. You may drive when you are no longer taking prescription pain medication, you can comfortably wear a seatbelt, and you can safely maneuver your car and apply brakes. e. Dennis Bast may have sexual intercourse when it is comfortable.  9. FOLLOW UP in our office a. Please call [facility] at [phone] to set up an appointment to see your surgeon in the office for a follow-up appointment approximately 1-2 weeks after your surgery. b. Make sure that you call for this appointment the day you arrive home to insure a convenient appointment time. 10. IF YOU HAVE DISABILITY  OR FAMILY LEAVE FORMS, BRING THEM TO THE OFFICE FOR PROCESSING.  DO NOT GIVE THEM TO YOUR DOCTOR.   WHEN TO CALL us [phone]: 1. Poor pain control 2. Reactions / problems with new medications (rash/itching, nausea, etc)  3. Fever over 101.5 F (38.5 C) 4. Inability to urinate 5. Nausea and/or vomiting 6. Worsening swelling or bruising 7. Continued bleeding from incision. 8. Increased pain, redness, or drainage from the incision  The clinic staff is available to answer your questions during regular business hours (8:30am-5pm).  Please don't hesitate  to call and ask to speak to one of our nurses for clinical concerns.   A surgeon from [facility] is always on call at the hospitals   If you have a medical emergency, go to the nearest emergency room or call 911.    [facility], [address], [place], [place],   [id number] ? MAIN: [phone] ? TOLL FREE: [phone] ? FAX [phone] [email or web address]   GETTING TO GOOD BOWEL HEALTH. Irregular bowel habits such as constipation and diarrhea can lead to many problems over time.  Having one soft bowel movement a day is the most important way to prevent further problems.  The anorectal canal is designed to handle stretching and feces to safely manage our ability to get rid of solid waste (feces, poop, stool) out of our body.  BUT, hard constipated stools can act like ripping concrete bricks and diarrhea can be a burning fire to this very sensitive area of our body, causing inflamed hemorrhoids, anal fissures, increasing risk is perirectal abscesses, abdominal pain/bloating, an making irritable bowel worse.     The goal: ONE SOFT BOWEL MOVEMENT A DAY!  To have soft, regular bowel movements:    Drink at least 8 tall glasses of water a day.     Take plenty of fiber.  Fiber is the undigested part of plant food that passes into the colon, acting s "natures broom" to encourage bowel  motility and movement.  Fiber can absorb and hold large amounts of water. This results in a larger, bulkier stool, which is soft and easier to pass. Work gradually over several weeks up to 6 servings a day of fiber (25g a day even more if needed) in the form of: o Vegetables -- Root (potatoes, carrots, turnips), leafy green (lettuce, salad greens, celery, spinach), or cooked high residue (cabbage, broccoli, etc) o Fruit -- Fresh (unpeeled skin & pulp), Dried (prunes, apricots, cherries, etc ),  or stewed ( applesauce)  o Whole grain breads, pasta, etc (whole wheat)  o Bran cereals    Bulking Agents -- This type of water-retaining fiber generally is easily obtained each day by one of the following:  o Psyllium bran -- The psyllium plant is remarkable because its ground seeds can retain so much water. This product is available as Metamucil, Konsyl, Effersyllium, Per Diem Fiber, or the less expensive generic preparation in drug and health food stores. Although labeled a laxative, it really is not a laxative.  o Methylcellulose -- This is another fiber derived from wood which also retains water. It is available as Citrucel. o Polyethylene Glycol - and "artificial" fiber commonly called Miralax or Glycolax.  It is helpful for people with gassy or bloated feelings with regular fiber o Flax Seed - a less gassy fiber than psyllium   No reading or other relaxing activity while on the toilet. If bowel movements take longer than 5 minutes, you are too constipated   AVOID CONSTIPATION.  High fiber and water intake usually takes care of this.  Sometimes a laxative is needed to stimulate more frequent bowel movements, but    Laxatives are not a good long-term solution as it can wear the colon out. o Osmotics (Milk of Magnesia, Fleets phosphosoda, Magnesium citrate, MiraLax, GoLytely) are safer than  o Stimulants (Senokot, Castor Oil, Dulcolax, Ex Lax)    o Do not take laxatives for more than 7days in a row.    IF  SEVERELY CONSTIPATED, try a Bowel  Retraining Program: o Do not use laxatives.  o Eat a diet high in roughage, such as bran cereals and leafy vegetables.  o Drink six (6) ounces of prune or apricot juice each morning.  o Eat two (2) large servings of stewed fruit each day.  o Take one (1) heaping tablespoon of a psyllium-based bulking agent twice a day. Use sugar-free sweetener when possible to avoid excessive calories.  o Eat a normal breakfast.  o Set aside 15 minutes after breakfast to sit on the toilet, but do not strain to have a bowel movement.  o If you do not have a bowel movement by the third day, use an enema and repeat the above steps.    Controlling diarrhea o Switch to liquids and simpler foods for a few days to avoid stressing your intestines further. o Avoid dairy products (especially milk & ice cream) for a short time.  The intestines often can lose the ability to digest lactose when stressed. o Avoid foods that cause gassiness or bloating.  Typical foods include beans and other legumes, cabbage, broccoli, and dairy foods.  Every person has some sensitivity to other foods, so listen to our body and avoid those foods that trigger problems for you. o Adding fiber (Citrucel, Metamucil, psyllium, Miralax) gradually can help thicken stools by absorbing excess fluid and retrain the intestines to act more normally.  Slowly increase the dose over a few weeks.  Too much fiber too soon can backfire and cause cramping & bloating. o Probiotics (such as active yogurt, Align, etc) may help repopulate the intestines and colon with normal bacteria and calm down a sensitive digestive tract.  Most studies show it to be of mild help, though, and such products can be costly. o Medicines:   Bismuth subsalicylate (ex. Kayopectate, Pepto Bismol) every 30 minutes for up to 6 doses can help control diarrhea.  Avoid if pregnant.   Loperamide (Immodium) can slow down diarrhea.  Start with two tablets (4mg   total) first and then try one tablet every 6 hours.  Avoid if you are having fevers or severe pain.  If you are not better or start feeling worse, stop all medicines and call your doctor for advice o Call your doctor if you are getting worse or not better.  Sometimes further testing (cultures, endoscopy, X-ray studies, bloodwork, etc) may be needed to help diagnose and treat the cause of the diarrhea. o

## 2013-07-17 NOTE — Progress Notes (Signed)
Subjective:     Patient ID: Christine Shepherd, female   DOB: 1950-11-10, 63 y.o.   MRN: [id number]  HPI  Note: This dictation was prepared with Dragon/digital dictation along with [employer]. Any transcriptional errors that result from this process are unintentional.       Christine Shepherd  [birth year] [id number]  Patient Care Team: Criselda Peaches, MD as PCP - General (Internal Medicine) Fara Chute, PA-C as Physician Assistant (Physician Assistant) Adin Hector, MD as Consulting Physician (General Surgery) Lafayette Dragon, MD as Consulting Physician (Gastroenterology)  This patient is a 63 y.o.female who presents today for surgical evaluation at the request of Dr. Olevia Perches.   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  Concerned for need for colectomy.  Pleasant woman.  I perform cholecystectomy for her for chronic cholecystitis last year.  She recovered from that well.  Had a colonoscopy 10 years ago.  Question of polyps.  Biopsies came back as benign mucosa.  Ten-year followup denotes more polyps.  Larger one in the sigmoid colon removed piecemeal with bleeding.  Clip placed.  Pathology came back with a 4 mm focus of adenocarcinoma.  Polyp in pieces.  Cancer at the cauterized margin.  Hard to tell if margins definitely negative but maybe not.  Therefore, surgical consultation requested.  The patient walks 2 miles without difficulty.  No cardiopulmonary issues.  Bowel movement every day.  No personal nor family history of GI/colon cancer, inflammatory bowel disease, irritable bowel syndrome, allergy such as Celiac Sprue, dietary/dairy problems, colitis, ulcers nor gastritis.  No recent sick contacts/gastroenteritis.  No travel outside the country.  No changes in diet.    Patient Active Problem List   Diagnosis Date Noted  . Cancer of proximal sigmoid colon 07/17/2013  . Hypertension   . Thyroid disease     Past  Medical History  Diagnosis Date  . Hypertension   . Thyroid disease   . Gallstones   . Acute cholecystitis with chronic cholecystitis s/p lap chole [id number] 11/15/2011    Past Surgical History  Procedure Laterality Date  . Abdominal hysterectomy    . Dilation and curettage of uterus    . Tubal ligation    . Cholecystectomy  11/17/11    History   Social History  . Marital Status: Divorced    Spouse Name: N/A    Number of Children: N/A  . Years of Education: N/A   Occupational History  . Not on file.   Social History Main Topics  . Smoking status: Never Smoker   . Smokeless tobacco: Never Used  . Alcohol Use: No  . Drug Use: No  . Sexual Activity: Not on file   Other Topics Concern  . Not on file   Social History Narrative  . No narrative on file    Family History  Problem Relation Age of Onset  . Cancer Father     Prostate Ca  . Cancer Brother     unknown  . Colon cancer Neg Hx     Current Outpatient Prescriptions  Medication Sig Dispense Refill  . estradiol (VIVELLE-DOT) 0.05 MG/24HR Place 1 patch onto the skin 2 (two) times a week.      [name] ibuprofen (ADVIL,MOTRIN) 200 MG tablet Take 200 mg by mouth every 6 (six) hours as needed. Pain      . levothyroxine (SYNTHROID, LEVOTHROID) 75 MCG tablet Take 75 mcg by mouth daily with breakfast.       .  Multiple Vitamin (MULITIVITAMIN WITH MINERALS) TABS Take 1 tablet by mouth daily.      . valACYclovir (VALTREX) 1000 MG tablet       . valsartan-hydrochlorothiazide (DIOVAN-HCT) 160-12.5 MG per tablet Take 1 tablet by mouth daily.       No current facility-administered medications for this visit.     No Known Allergies  BP 166/90  Pulse 96  Temp(Src) 98.9 F (37.2 C) (Temporal)  Resp 15  Ht 5\' 6"  (1.676 m)  Wt 168 lb 9.6 oz (76.476 kg)  BMI 27.23 kg/m2  Ct Abdomen Pelvis W Contrast  07/03/2013   CLINICAL DATA:  Colon cancer  EXAM: CT ABDOMEN AND PELVIS WITH CONTRAST  TECHNIQUE: Multidetector CT imaging of  the abdomen and pelvis was performed using the standard protocol following bolus administration of intravenous contrast.  CONTRAST:  179mL OMNIPAQUE IOHEXOL 300 MG/ML  SOLN  COMPARISON:  None.  FINDINGS: Lung bases appear clear.  No pleural or pericardial effusion.  There is no focal liver abnormality identified. Previous coli cystectomy. Normal appearance of the pancreas. The spleen is negative.  The adrenal glands are both normal. Normal appearance of both kidneys. The urinary bladder appears normal. Previous hysterectomy.  There is mild calcified atherosclerotic disease affecting the abdominal aorta. No aneurysm identified. No enlarged mesenteric lymph nodes identified. No retroperitoneal adenopathy noted. There is no pelvic or inguinal adenopathy identified.  The stomach appears normal. The small bowel loops have a normal course and caliber without evidence for obstruction. The proximal colon appears normal. Endoclip is identified at the biopsy site within the sigmoid colon, image 55/series 2. There is mild circumferential wall thickening of the colon at this level. The mid and distal sigmoid colon appear normal. The rectum is unremarkable.  Review of the visualized osseous structures shows mild multi level lumbar spondylosis. No aggressive lytic or sclerotic bone lesions identified. The patient has a supraumbilical hernia which contains fat only.  IMPRESSION: 1. No acute findings and no evidence for metastatic disease. 2. Endoclip identified within the proximal sigmoid colon. There is associated, nonspecific mild wall thickening of the colon at this level.   Electronically Signed   By: Kerby Moors M.D.   On: 07/03/2013 11:24     Review of Systems  Constitutional: Negative for fever, chills, diaphoresis, appetite change and fatigue.  HENT: Negative for ear discharge, ear pain, sore throat and trouble swallowing.   Eyes: Negative for photophobia, discharge and visual disturbance.  Respiratory: Negative  for cough, choking, chest tightness and shortness of breath.   Cardiovascular: Negative for chest pain and palpitations.  Gastrointestinal: Negative for nausea, vomiting, abdominal pain, diarrhea, constipation, anal bleeding and rectal pain.  Endocrine: Negative for cold intolerance and heat intolerance.  Genitourinary: Negative for dysuria, frequency and difficulty urinating.  Musculoskeletal: Negative for gait problem, myalgias and neck pain.  Skin: Negative for color change, pallor and rash.  Allergic/Immunologic: Negative for environmental allergies, food allergies and immunocompromised state.  Neurological: Negative for dizziness, speech difficulty, weakness and numbness.  Hematological: Negative for adenopathy.  Psychiatric/Behavioral: Negative for confusion and agitation. The patient is not nervous/anxious.        Objective:   Physical Exam  Constitutional: She is oriented to person, place, and time. She appears well-developed and well-nourished. No distress.  HENT:  Head: Normocephalic.  Mouth/Throat: Oropharynx is clear and moist. No oropharyngeal exudate.  Eyes: Conjunctivae and EOM are normal. Pupils are equal, round, and reactive to light. No scleral icterus.  Neck: Normal range  of motion. Neck supple. No tracheal deviation present.  Cardiovascular: Normal rate, regular rhythm and intact distal pulses.   Pulmonary/Chest: Effort normal and breath sounds normal. No stridor. No respiratory distress. She exhibits no tenderness.  Abdominal: Soft. She exhibits no distension and no mass. There is no tenderness. Hernia confirmed negative in the right inguinal area and confirmed negative in the left inguinal area.  Genitourinary: No vaginal discharge found.  Musculoskeletal: Normal range of motion. She exhibits no tenderness.       Right elbow: She exhibits normal range of motion.       Left elbow: She exhibits normal range of motion.       Right wrist: She exhibits normal range of  motion.       Left wrist: She exhibits normal range of motion.       Right hand: Normal strength noted.       Left hand: Normal strength noted.  Lymphadenopathy:       Head (right side): No posterior auricular adenopathy present.       Head (left side): No posterior auricular adenopathy present.    She has no cervical adenopathy.    She has no axillary adenopathy.       Right: No inguinal adenopathy present.       Left: No inguinal adenopathy present.  Neurological: She is alert and oriented to person, place, and time. No cranial nerve deficit. She exhibits normal muscle tone. Coordination normal.  Skin: Skin is warm and dry. No rash noted. She is not diaphoretic. No erythema.  Psychiatric: She has a normal mood and affect. Her behavior is normal. Judgment and thought content normal.       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 Marcello Moores.  I called and discussed with the patient's gastroenterologist, Dr. Olevia Perches.  I had a long discussion with the patient and her significant other, reviewing films and discussing with audiovisual AIDS.  Given the fact that she has cancer within the polyp with an equivocal margin and at least S. And one staging, most aggressive option and a healthy active woman would be to do segmental colectomy to make sure no remaining tumor is involved and lymph nodes or negative.  Another option is to just do aggressive endoscopic monitoring.  However, Dr. Olevia Perches is concerned that the location is too tortuous that monitoring is difficult for accuracy.  I have asked Dr. Olevia Perches to tattoo the scar to make sure we know where the problem is.  At this point, I recommend minimally invasive sigmoid resection as the best chance at accurate staging and potential cure.  She wishes to be aggressive.  Good  candidate for minimally invasive approach.  Robotic vs. Laparoscopic:  The anatomy & physiology of the digestive tract was discussed.  The pathophysiology was discussed.  Natural history risks without surgery was discussed.   I feel the risks of no intervention will lead to serious problems that outweigh the operative risks; therefore, I recommended a partial colectomy to remove the pathology.  Laparoscopic & open techniques were discussed.   Risks such as bleeding, infection, abscess, leak, reoperation, possible ostomy, hernia, heart attack, death, and other risks were discussed.  I noted a good likelihood this will help address the problem.   Goals of post-operative  recovery were discussed as well.  We will work to minimize complications.  An educational handout on the pathology was given as well.  Questions were answered.  The patient expresses understanding & wishes to proceed with surgery.

## 2013-07-17 NOTE — Telephone Encounter (Signed)
Called to see about pt getting back in to see Dr Olevia Perches to get tattooed ASAP before pt's surgery per Dr Johney Maine. The pt will be seen on 07/22/13 at 10:00.

## 2013-07-17 NOTE — Telephone Encounter (Signed)
Spoke with patient and gave her date and time of procedure. She will come to office tomorrow to pick up written instructions.

## 2013-07-17 NOTE — Addendum Note (Signed)
Addended by: Adin Hector on: 07/17/2013 02:12 PM   Modules accepted: Orders

## 2013-07-18 ENCOUNTER — Telehealth (INDEPENDENT_AMBULATORY_CARE_PROVIDER_SITE_OTHER): Payer: Self-pay | Admitting: *Deleted

## 2013-07-18 NOTE — Telephone Encounter (Signed)
Patient came to office and was given verbal and written instructions. Patient verbalizes understanding.

## 2013-07-18 NOTE — Telephone Encounter (Signed)
I spoke with pt and informed her of the appt for her Chest CT at [facility] on 07/24/13 with an arrival time of 1:45pm.  I instructed her to have NO solid foods 4 hours prior to scan.  She is agreeable with this appt and instructions given.

## 2013-07-19 ENCOUNTER — Ambulatory Visit: Payer: BC Managed Care – PPO

## 2013-07-19 ENCOUNTER — Ambulatory Visit (INDEPENDENT_AMBULATORY_CARE_PROVIDER_SITE_OTHER): Payer: BC Managed Care – PPO | Admitting: Emergency Medicine

## 2013-07-19 VITALS — BP 136/84 | HR 127 | Temp 100.3°F | Resp 18 | Ht 64.5 in | Wt 167.0 lb

## 2013-07-19 DIAGNOSIS — R509 Fever, unspecified: Secondary | ICD-10-CM

## 2013-07-19 DIAGNOSIS — J029 Acute pharyngitis, unspecified: Secondary | ICD-10-CM

## 2013-07-19 DIAGNOSIS — R05 Cough: Secondary | ICD-10-CM

## 2013-07-19 DIAGNOSIS — R059 Cough, unspecified: Secondary | ICD-10-CM

## 2013-07-19 LAB — POCT CBC
Granulocyte percent: 84.8 %G — AB (ref 37–80)
HCT, POC: 43.4 % (ref 37.7–47.9)
Hemoglobin: 13.9 g/dL (ref 12.2–16.2)
Lymph, poc: 1.3 (ref 0.6–3.4)
MCH, POC: 30.1 pg (ref 27–31.2)
MCHC: 32 g/dL (ref 31.8–35.4)
MCV: 94 fL (ref 80–97)
MID (cbc): 1 — AB (ref 0–0.9)
MPV: 10.8 fL (ref 0–99.8)
POC Granulocyte: 12.5 — AB (ref 2–6.9)
POC LYMPH PERCENT: 8.6 %L — AB (ref 10–50)
POC MID %: 6.6 %M (ref 0–12)
Platelet Count, POC: 236 10*3/uL (ref 142–424)
RBC: 4.62 M/uL (ref 4.04–5.48)
RDW, POC: 14.2 %
WBC: 14.7 10*3/uL — AB (ref 4.6–10.2)

## 2013-07-19 LAB — POCT RAPID STREP A (OFFICE): Rapid Strep A Screen: NEGATIVE

## 2013-07-19 MED ORDER — LEVOFLOXACIN 500 MG PO TABS: 500.0000 mg | ORAL_TABLET | Freq: Every day | ORAL | Status: DC

## 2013-07-19 NOTE — Progress Notes (Signed)
Subjective:    Patient ID: Christine Shepherd, female    DOB: 1951-01-04, 63 y.o.   MRN: [id number]  HPI 63 year old female presents for evaluation of 2 day history of sore throat and low grade fever. Was treated for bronchitis on 07/08/13 - states she did start to feel much better after taking Zpack.  Continues to have only slight cough that is dry and nonproductive.  Developed low grade fever today.  Has not taken any OTC medications for this.  Denies chest pain, SOB, wheezing, headache, otalgia, sinus pain, nausea, or vomiting.  Admits to hx of frequent strep infections when she was younger. No known strep exposure.  Patient is otherwise doing well with no other concerns today.     Review of Systems  Constitutional: Positive for fever and chills.  HENT: Positive for rhinorrhea, sinus pressure and sore throat. Negative for congestion, ear pain, postnasal drip and trouble swallowing.   Respiratory: Positive for cough. Negative for chest tightness, shortness of breath and wheezing.   Cardiovascular: Negative for chest pain.  Gastrointestinal: Negative for nausea, vomiting and abdominal pain.  Neurological: Negative for dizziness and headaches.       Objective:   Physical Exam  Constitutional: She is oriented to person, place, and time. She appears well-developed and well-nourished.  HENT:  Head: Normocephalic and atraumatic.  Right Ear: Hearing, tympanic membrane, external ear and ear canal normal.  Left Ear: Hearing, tympanic membrane, external ear and ear canal normal.  Mouth/Throat: Uvula is midline and mucous membranes are normal. Posterior oropharyngeal erythema (2+ tonsillar swelling) present. No oropharyngeal exudate, posterior oropharyngeal edema or tonsillar abscesses.  Eyes: Conjunctivae are normal.  Neck: Normal range of motion. Neck supple.  Cardiovascular: Normal rate, regular rhythm and normal heart sounds.   Pulmonary/Chest: Effort normal and breath sounds normal.    Neurological: She is alert and oriented to person, place, and time.  Psychiatric: She has a normal mood and affect. Her behavior is normal. Judgment and thought content normal.    Results for orders placed in visit on 07/19/13  POCT CBC      Result Value Range   WBC 14.7 (*) 4.6 - 10.2 K/uL   Lymph, poc 1.3  0.6 - 3.4   POC LYMPH PERCENT 8.6 (*) 10 - 50 %L   MID (cbc) 1.0 (*) 0 - 0.9   POC MID % 6.6  0 - 12 %M   POC Granulocyte 12.5 (*) 2 - 6.9   Granulocyte percent 84.8 (*) 37 - 80 %G   RBC 4.62  4.04 - 5.48 M/uL   Hemoglobin 13.9  12.2 - 16.2 g/dL   HCT, POC 43.4  37.7 - 47.9 %   MCV 94.0  80 - 97 fL   MCH, POC 30.1  27 - 31.2 pg   MCHC 32.0  31.8 - 35.4 g/dL   RDW, POC 14.2     Platelet Count, POC 236  142 - 424 K/uL   MPV 10.8  0 - 99.8 fL  POCT RAPID STREP A (OFFICE)      Result Value Range   Rapid Strep A Screen Negative  Negative    [facility] reading (PRIMARY) by  Dr. Everlene Farrier as no acute infiltrate or consolidation.      Assessment & Plan:  Acute pharyngitis - Plan: POCT CBC, POCT rapid strep A, Culture, Group A Strep  Cough - Plan: DG Chest 2 View, levofloxacin (LEVAQUIN) 500 MG tablet  Fever - Plan: DG Chest  2 View throat culture sent Unsure of etiology of leukocytosis, but will place on levaquin for broad-spectrum coverage.   Will cover with Levaquin 500 mg daily x 7 days May continue Hycodan qhs prn cough Increase fluids and rest.  Follow up in 24-48 hours if symptoms fail to improve.

## 2013-07-21 ENCOUNTER — Telehealth: Payer: Self-pay

## 2013-07-21 LAB — CULTURE, GROUP A STREP: Organism ID, Bacteria: NORMAL

## 2013-07-21 NOTE — Telephone Encounter (Signed)
Patient called to speak with someone in the lab. Please return her call. Thank you!

## 2013-07-21 NOTE — Telephone Encounter (Signed)
See lab

## 2013-07-22 ENCOUNTER — Ambulatory Visit (HOSPITAL_COMMUNITY)
Admission: RE | Admit: 2013-07-22 | Discharge: 2013-07-22 | Disposition: A | Discharge status: Home / Self Care | Payer: BC Managed Care – PPO | Source: Ambulatory Visit | Attending: Internal Medicine | Admitting: Internal Medicine

## 2013-07-22 ENCOUNTER — Encounter (HOSPITAL_COMMUNITY)
Admission: RE | Disposition: A | Discharge status: Home / Self Care | Payer: Self-pay | Source: Ambulatory Visit | Attending: Internal Medicine

## 2013-07-22 ENCOUNTER — Encounter (HOSPITAL_COMMUNITY): Payer: Self-pay

## 2013-07-22 DIAGNOSIS — I10 Essential (primary) hypertension: Secondary | ICD-10-CM | POA: Insufficient documentation

## 2013-07-22 DIAGNOSIS — D371 Neoplasm of uncertain behavior of stomach: Secondary | ICD-10-CM | POA: Insufficient documentation

## 2013-07-22 DIAGNOSIS — D378 Neoplasm of uncertain behavior of other specified digestive organs: Secondary | ICD-10-CM

## 2013-07-22 DIAGNOSIS — D375 Neoplasm of uncertain behavior of rectum: Secondary | ICD-10-CM

## 2013-07-22 DIAGNOSIS — Z79899 Other long term (current) drug therapy: Secondary | ICD-10-CM | POA: Insufficient documentation

## 2013-07-22 DIAGNOSIS — Z9089 Acquired absence of other organs: Secondary | ICD-10-CM | POA: Insufficient documentation

## 2013-07-22 DIAGNOSIS — C189 Malignant neoplasm of colon, unspecified: Secondary | ICD-10-CM | POA: Insufficient documentation

## 2013-07-22 HISTORY — PX: FLEXIBLE SIGMOIDOSCOPY: SHX5431

## 2013-07-22 HISTORY — DX: Adverse effect of unspecified anesthetic, initial encounter: T41.45XA

## 2013-07-22 SURGERY — SIGMOIDOSCOPY, FLEXIBLE
Anesthesia: Moderate Sedation

## 2013-07-22 MED ORDER — SODIUM CHLORIDE 0.9 % IV SOLN
INTRAVENOUS | Status: DC
  Administered 2013-07-22: 09:00:00 via INTRAVENOUS

## 2013-07-22 MED ORDER — FENTANYL CITRATE 0.05 MG/ML IJ SOLN
INTRAMUSCULAR | Status: AC
  Filled 2013-07-22: qty 2

## 2013-07-22 MED ORDER — SPOT INK MARKER SYRINGE KIT
PACK | SUBMUCOSAL | Status: DC | PRN
  Administered 2013-07-22: 4 mL via SUBMUCOSAL

## 2013-07-22 MED ORDER — SPOT INK MARKER SYRINGE KIT
PACK | SUBMUCOSAL | Status: AC
  Filled 2013-07-22: qty 5

## 2013-07-22 MED ORDER — DIPHENHYDRAMINE HCL 50 MG/ML IJ SOLN
INTRAMUSCULAR | Status: AC
  Filled 2013-07-22: qty 1

## 2013-07-22 MED ORDER — FENTANYL CITRATE 0.05 MG/ML IJ SOLN
INTRAMUSCULAR | Status: DC | PRN
  Administered 2013-07-22 (×4): 25 ug via INTRAVENOUS

## 2013-07-22 MED ORDER — MIDAZOLAM HCL 10 MG/2ML IJ SOLN
INTRAMUSCULAR | Status: DC | PRN
  Administered 2013-07-22 (×4): 2.5 mg via INTRAVENOUS

## 2013-07-22 MED ORDER — MIDAZOLAM HCL 10 MG/2ML IJ SOLN
INTRAMUSCULAR | Status: AC
  Filled 2013-07-22: qty 2

## 2013-07-22 NOTE — H&P (View-Only) (Signed)
Subjective:     Patient ID: Christine Shepherd, female   DOB: 1950/10/13, 63 y.o.   MRN: [id number]  HPI  Note: This dictation was prepared with Dragon/digital dictation along with [employer]. Any transcriptional errors that result from this process are unintentional.       Christine Shepherd  April 09, 1951 [id number]  Patient Care Team: Criselda Peaches, MD as PCP - General (Internal Medicine) Fara Chute, PA-C as Physician Assistant (Physician Assistant) Adin Hector, MD as Consulting Physician (General Surgery) Lafayette Dragon, MD as Consulting Physician (Gastroenterology)  This patient is a 63 y.o.female who presents today for surgical evaluation at the request of Dr. Olevia Perches.   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  Concerned for need for colectomy.  Pleasant woman.  I perform cholecystectomy for her for chronic cholecystitis last year.  She recovered from that well.  Had a colonoscopy 10 years ago.  Question of polyps.  Biopsies came back as benign mucosa.  Ten-year followup denotes more polyps.  Larger one in the sigmoid colon removed piecemeal with bleeding.  Clip placed.  Pathology came back with a 4 mm focus of adenocarcinoma.  Polyp in pieces.  Cancer at the cauterized margin.  Hard to tell if margins definitely negative but maybe not.  Therefore, surgical consultation requested.  The patient walks 2 miles without difficulty.  No cardiopulmonary issues.  Bowel movement every day.  No personal nor family history of GI/colon cancer, inflammatory bowel disease, irritable bowel syndrome, allergy such as Celiac Sprue, dietary/dairy problems, colitis, ulcers nor gastritis.  No recent sick contacts/gastroenteritis.  No travel outside the country.  No changes in diet.    Patient Active Problem List   Diagnosis Date Noted  . Cancer of proximal sigmoid colon 07/17/2013  . Hypertension   . Thyroid disease     Past  Medical History  Diagnosis Date  . Hypertension   . Thyroid disease   . Gallstones   . Acute cholecystitis with chronic cholecystitis s/p lap chole [id number] 11/15/2011    Past Surgical History  Procedure Laterality Date  . Abdominal hysterectomy    . Dilation and curettage of uterus    . Tubal ligation    . Cholecystectomy  11/17/11    History   Social History  . Marital Status: Divorced    Spouse Name: N/A    Number of Children: N/A  . Years of Education: N/A   Occupational History  . Not on file.   Social History Main Topics  . Smoking status: Never Smoker   . Smokeless tobacco: Never Used  . Alcohol Use: No  . Drug Use: No  . Sexual Activity: Not on file   Other Topics Concern  . Not on file   Social History Narrative  . No narrative on file    Family History  Problem Relation Age of Onset  . Cancer Father     Prostate Ca  . Cancer Brother     unknown  . Colon cancer Neg Hx     Current Outpatient Prescriptions  Medication Sig Dispense Refill  . estradiol (VIVELLE-DOT) 0.05 MG/24HR Place 1 patch onto the skin 2 (two) times a week.      [name] ibuprofen (ADVIL,MOTRIN) 200 MG tablet Take 200 mg by mouth every 6 (six) hours as needed. Pain      . levothyroxine (SYNTHROID, LEVOTHROID) 75 MCG tablet Take 75 mcg by mouth daily with breakfast.       .  Multiple Vitamin (MULITIVITAMIN WITH MINERALS) TABS Take 1 tablet by mouth daily.      . valACYclovir (VALTREX) 1000 MG tablet       . valsartan-hydrochlorothiazide (DIOVAN-HCT) 160-12.5 MG per tablet Take 1 tablet by mouth daily.       No current facility-administered medications for this visit.     No Known Allergies  BP 166/90  Pulse 96  Temp(Src) 98.9 F (37.2 C) (Temporal)  Resp 15  Ht 5\' 6"  (1.676 m)  Wt 168 lb 9.6 oz (76.476 kg)  BMI 27.23 kg/m2  Ct Abdomen Pelvis W Contrast  07/03/2013   CLINICAL DATA:  Colon cancer  EXAM: CT ABDOMEN AND PELVIS WITH CONTRAST  TECHNIQUE: Multidetector CT imaging of  the abdomen and pelvis was performed using the standard protocol following bolus administration of intravenous contrast.  CONTRAST:  145mL OMNIPAQUE IOHEXOL 300 MG/ML  SOLN  COMPARISON:  None.  FINDINGS: Lung bases appear clear.  No pleural or pericardial effusion.  There is no focal liver abnormality identified. Previous coli cystectomy. Normal appearance of the pancreas. The spleen is negative.  The adrenal glands are both normal. Normal appearance of both kidneys. The urinary bladder appears normal. Previous hysterectomy.  There is mild calcified atherosclerotic disease affecting the abdominal aorta. No aneurysm identified. No enlarged mesenteric lymph nodes identified. No retroperitoneal adenopathy noted. There is no pelvic or inguinal adenopathy identified.  The stomach appears normal. The small bowel loops have a normal course and caliber without evidence for obstruction. The proximal colon appears normal. Endoclip is identified at the biopsy site within the sigmoid colon, image 55/series 2. There is mild circumferential wall thickening of the colon at this level. The mid and distal sigmoid colon appear normal. The rectum is unremarkable.  Review of the visualized osseous structures shows mild multi level lumbar spondylosis. No aggressive lytic or sclerotic bone lesions identified. The patient has a supraumbilical hernia which contains fat only.  IMPRESSION: 1. No acute findings and no evidence for metastatic disease. 2. Endoclip identified within the proximal sigmoid colon. There is associated, nonspecific mild wall thickening of the colon at this level.   Electronically Signed   By: Kerby Moors M.D.   On: 07/03/2013 11:24     Review of Systems  Constitutional: Negative for fever, chills, diaphoresis, appetite change and fatigue.  HENT: Negative for ear discharge, ear pain, sore throat and trouble swallowing.   Eyes: Negative for photophobia, discharge and visual disturbance.  Respiratory: Negative  for cough, choking, chest tightness and shortness of breath.   Cardiovascular: Negative for chest pain and palpitations.  Gastrointestinal: Negative for nausea, vomiting, abdominal pain, diarrhea, constipation, anal bleeding and rectal pain.  Endocrine: Negative for cold intolerance and heat intolerance.  Genitourinary: Negative for dysuria, frequency and difficulty urinating.  Musculoskeletal: Negative for gait problem, myalgias and neck pain.  Skin: Negative for color change, pallor and rash.  Allergic/Immunologic: Negative for environmental allergies, food allergies and immunocompromised state.  Neurological: Negative for dizziness, speech difficulty, weakness and numbness.  Hematological: Negative for adenopathy.  Psychiatric/Behavioral: Negative for confusion and agitation. The patient is not nervous/anxious.        Objective:   Physical Exam  Constitutional: She is oriented to person, place, and time. She appears well-developed and well-nourished. No distress.  HENT:  Head: Normocephalic.  Mouth/Throat: Oropharynx is clear and moist. No oropharyngeal exudate.  Eyes: Conjunctivae and EOM are normal. Pupils are equal, round, and reactive to light. No scleral icterus.  Neck: Normal range  of motion. Neck supple. No tracheal deviation present.  Cardiovascular: Normal rate, regular rhythm and intact distal pulses.   Pulmonary/Chest: Effort normal and breath sounds normal. No stridor. No respiratory distress. She exhibits no tenderness.  Abdominal: Soft. She exhibits no distension and no mass. There is no tenderness. Hernia confirmed negative in the right inguinal area and confirmed negative in the left inguinal area.  Genitourinary: No vaginal discharge found.  Musculoskeletal: Normal range of motion. She exhibits no tenderness.       Right elbow: She exhibits normal range of motion.       Left elbow: She exhibits normal range of motion.       Right wrist: She exhibits normal range of  motion.       Left wrist: She exhibits normal range of motion.       Right hand: Normal strength noted.       Left hand: Normal strength noted.  Lymphadenopathy:       Head (right side): No posterior auricular adenopathy present.       Head (left side): No posterior auricular adenopathy present.    She has no cervical adenopathy.    She has no axillary adenopathy.       Right: No inguinal adenopathy present.       Left: No inguinal adenopathy present.  Neurological: She is alert and oriented to person, place, and time. No cranial nerve deficit. She exhibits normal muscle tone. Coordination normal.  Skin: Skin is warm and dry. No rash noted. She is not diaphoretic. No erythema.  Psychiatric: She has a normal mood and affect. Her behavior is normal. Judgment and thought content normal.       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 Marcello Moores.  I called and discussed with the patient's gastroenterologist, Dr. Olevia Perches.  I had a long discussion with the patient and her significant other, reviewing films and discussing with audiovisual AIDS.  Given the fact that she has cancer within the polyp with an equivocal margin and at least S. And one staging, most aggressive option and a healthy active woman would be to do segmental colectomy to make sure no remaining tumor is involved and lymph nodes or negative.  Another option is to just do aggressive endoscopic monitoring.  However, Dr. Olevia Perches is concerned that the location is too tortuous that monitoring is difficult for accuracy.  I have asked Dr. Olevia Perches to tattoo the scar to make sure we know where the problem is.  At this point, I recommend minimally invasive sigmoid resection as the best chance at accurate staging and potential cure.  She wishes to be aggressive.  Good  candidate for minimally invasive approach.  Robotic vs. Laparoscopic:  The anatomy & physiology of the digestive tract was discussed.  The pathophysiology was discussed.  Natural history risks without surgery was discussed.   I feel the risks of no intervention will lead to serious problems that outweigh the operative risks; therefore, I recommended a partial colectomy to remove the pathology.  Laparoscopic & open techniques were discussed.   Risks such as bleeding, infection, abscess, leak, reoperation, possible ostomy, hernia, heart attack, death, and other risks were discussed.  I noted a good likelihood this will help address the problem.   Goals of post-operative  recovery were discussed as well.  We will work to minimize complications.  An educational handout on the pathology was given as well.  Questions were answered.  The patient expresses understanding & wishes to proceed with surgery.

## 2013-07-22 NOTE — Interval H&P Note (Signed)
History and Physical Interval Note:  07/22/2013 9:45 AM  Christine Shepherd  has presented today for surgery, with the diagnosis of colon cancer, needs flex sig with tattooing  The various methods of treatment have been discussed with the patient and family. After consideration of risks, benefits and other options for treatment, the patient has consented to  Procedure(s) with comments: FLEXIBLE SIGMOIDOSCOPY (N/A) - ja/    tatoo as a surgical intervention .  The patient's history has been reviewed, patient examined, no change in status, stable for surgery.  I have reviewed the patient's chart and labs.  Questions were answered to the patient's satisfaction.     Delfin Edis

## 2013-07-22 NOTE — Op Note (Signed)
[facility] [place], [address]   FLEXIBLE SIGMOIDOSCOPY PROCEDURE REPORT  PATIENT: Christine Shepherd, Christine Shepherd.  MR#: [id number] BIRTHDATE: 20-Mar-1951 , 62  yrs. old GENDER: Female ENDOSCOPIST: Lafayette Dragon, MD REFERRED BY: Michael Boston, M.D. , Dr Zada Girt PROCEDURE DATE:  07/22/2013 PROCEDURE:   Sigmoidoscopy with snare  and tattoo  of a lesion ASA CLASS:   Class II INDICATIONS:colonic adenocarcinoma. , malignant polyp  partially removed  4 weeks ago, margins not free, base of the polyp was irregular, nodular, she is scheduled for LA segmental resection, by Dr Johney Maine MEDICATIONS: These medications were titrated to patient response per physician's verbal order, Fentanyl 100 mcg IV, and Versed 10 mg IV   DESCRIPTION OF PROCEDURE:   After the risks benefits and alternatives of the procedure were thoroughly explained, informed consent was obtained.  revealed no abnormalities of the rectum. The [id number]  endoscope was introduced through the anus  and advanced to the splenic flexure , limited by No adverse events experienced. The quality of the prep was good .  The instrument was then slowly withdrawn as the mucosa was fully examined.         COLON FINDINGS: An ulcerated sessile polyp was found in the sigmoid colon at 50 cm, ( it was the same polyp peviously located at 25 cm,, the endo clip was no longer present).  A partial  polypectomy was performed using snare cautery. The polyp was ulcerated and multilobulated, only one piece removed for path confirmation. 4 cc of SPOT marker was applied to 4 quadrants at the polyp site  The resection was incomplete and the polyp tissue was completely retrieved. Retroflexed views revealed no abnormalities.    The scope was then withdrawn from the patient and the procedure terminated.  COMPLICATIONS: There were no complications.  ENDOSCOPIC IMPRESSION: Sessile polyp was found in the sigmoid colon at 50 cm, the  same polyp described previously but location slightly changed due to colon  telescopping, partial polypectomy was performed using snare cautery  and 4 cc of SPOT tatttoo was applied at polyp site in 4 separate quadrants, polyp base  appeared friable and ulcerated supporting our suspicion for malignancy no other polyps found, exam  carried to  splenic flexure  RECOMMENDATIONS: await biopsy results pt to follow up with Dr Johney Maine for LA resection   REPEAT EXAM: In 1 year(s)  for Colonoscopy, pending biopsy results.   _______________________________ eSignedLafayette Dragon, MD 07/22/2013 11:02 AM   CC:  PATIENT NAME:  Christine Shepherd, Christine Shepherd. MR#: [id number]

## 2013-07-22 NOTE — Discharge Instructions (Signed)
Gastrointestinal Endoscopy Care After Refer to this sheet in the next few weeks. These instructions provide you with information on caring for yourself after your procedure. Your caregiver may also give you more specific instructions. Your treatment has been planned according to current medical practices, but problems sometimes occur. Call your caregiver if you have any problems or questions after your procedure. HOME CARE INSTRUCTIONS  If you were given medicine to help you relax (sedative), do not drive, operate machinery, or sign important documents for 24 hours.  Avoid alcohol and hot or warm beverages for the first 24 hours after the procedure.  Only take over-the-counter or prescription medicines for pain, discomfort, or fever as directed by your caregiver. You may resume taking your normal medicines unless your caregiver tells you otherwise. Ask your caregiver when you may resume taking medicines that may cause bleeding, such as aspirin, clopidogrel, or warfarin.  You may return to your normal diet and activities on the day after your procedure, or as directed by your caregiver. Walking may help to reduce any bloated feeling in your abdomen.  Drink enough fluids to keep your urine clear or pale yellow.  You may gargle with salt water if you have a sore throat. SEEK IMMEDIATE MEDICAL CARE IF:  You have severe nausea or vomiting.  You have severe abdominal pain, abdominal cramps that last longer than 6 hours, or abdominal swelling (distention).  You have severe shoulder or back pain.  You have trouble swallowing.  You have shortness of breath, your breathing is shallow, or you are breathing faster than normal.  You have a fever or a rapid heartbeat.  You vomit blood or material that looks like coffee grounds.  You have bloody, black, or tarry stools. MAKE SURE YOU:  Understand these instructions.  Will watch your condition.  Will get help right away if you are not doing  well or get worse. Document Released: 02/09/2004 Document Revised: 12/27/2011 Document Reviewed: 09/27/2011 [facility] Patient Information 2014 [place], [place].  Flexible Sigmoidoscopy Your caregiver has ordered a flexible sigmoidoscopy. This is an exam to evaluate your lower colon. In this exam your colon is cleansed and a short fiber optic tube is inserted through your rectum and into your colon. The fiber optic scope (endoscope) is a short bundle of enclosed flexible small glass fibers. It transmits light to the area examined and images from that area to your caregiver. You do not have to worry about glass breakage in the endoscope. Discomfort is usually minimal. Sedatives and pain medications are generally not required. This exam helps to detect tumors (lumps), polyps, inflammation (swelling and soreness), and areas of bleeding. It may also be used to take biopsies. These are small pieces of tissue taken to examine under a microscope. LET YOUR CAREGIVER KNOW ABOUT:  Allergies.  Medications taken including herbs, eye drops, over the counter medications, and creams.  Use of steroids (by mouth or creams).  Previous problems with anesthetics or novocaine  Possibility of pregnancy, if this applies.  History of blood clots (thrombophlebitis).  History of bleeding or blood problems.  Previous surgery.  Other health problems. BEFORE THE PROCEDURE Eat normally the night before the exam. Your caregiver may order a mild enema or laxative the night before. No eating or drinking should occur after midnight until the procedure is completed. A rectal suppository or enemas may be given in the morning prior to your procedure. You will be brought to the examination area in a hospital gown. You should be  present 60 minutes prior to your procedure or as directed.  AFTER THE PROCEDURE   There is sometimes a little blood passed with the first bowel movement. Do not be concerned. Because air is often used  during the exam, it is not unusual to pass gas and experience abdominal (belly) cramping. Walking or a warm pack on your abdomen may help with this. Do not sleep with a heating pad as burns can occur.  You may resume all normal eating and activities.  Only take over-the-counter or prescription medicines for pain, discomfort, or fever as directed by your caregiver. Do not use aspirin or blood thinners if a biopsy (tissue sample) was taken. Consult your caregiver for medication usage if biopsies were taken.  Call for your results as instructed by your caregiver. Remember, it is your responsibility to obtain the results of your biopsy. Do not assume everything is fine because you do not hear from your caregiver. SEEK IMMEDIATE MEDICAL CARE IF:  An oral temperature above 102 F (38.9 C) develops.  You pass large blood clots or fill a toilet with blood following the procedure. This may also occur 10 to 14 days following the procedure. It is more likely if a biopsy was taken.  You develop abdominal pain not relieved with medication or that is getting worse rather than better. Document Released: 06/24/2000 Document Revised: 09/19/2011 Document Reviewed: 04/06/2005 [facility] Patient Information 2014 [place].

## 2013-07-23 ENCOUNTER — Encounter (HOSPITAL_COMMUNITY): Payer: Self-pay | Admitting: Internal Medicine

## 2013-07-23 ENCOUNTER — Encounter: Payer: Self-pay | Admitting: Internal Medicine

## 2013-07-24 ENCOUNTER — Ambulatory Visit
Admission: RE | Admit: 2013-07-24 | Discharge: 2013-07-24 | Disposition: A | Discharge status: Home / Self Care | Payer: BC Managed Care – PPO | Source: Ambulatory Visit | Attending: Surgery | Admitting: Surgery

## 2013-07-24 DIAGNOSIS — C187 Malignant neoplasm of sigmoid colon: Secondary | ICD-10-CM

## 2013-07-24 MED ORDER — IOHEXOL 300 MG/ML  SOLN
75.0000 mL | Freq: Once | INTRAMUSCULAR | Status: AC | PRN
  Administered 2013-07-24: 75 mL via INTRAVENOUS

## 2013-07-24 NOTE — Progress Notes (Signed)
Bx of remaining polyp/scar shows adenocarcinoma.  Will proceed with recommended segmental resection to definitely reove all Christine Shepherd cancer & draining lymph nodes.  Adin Hector, M.D., F.A.C.S. Gastrointestinal and Minimally Invasive Surgery [facility] [address], [place], [place] [address] [phone] Main / Paging

## 2013-07-24 NOTE — Telephone Encounter (Signed)
Called pt to notify her that I am mailing her prep instructions to her in the mail. The prep is from Dr Johney Maine for lower distal prep instructions with 2 abx's attatched to the sheet. The pt is to do this prep the day before surgery. Pt advised to call me next week if she does not receive the prep in the mail. The pt understands.

## 2013-07-24 NOTE — Progress Notes (Signed)
I did see there is persistent cancer at the former polypectomy site.  Agree with continuing the recommendation of minimally invasive segmental colonic resection

## 2013-07-31 ENCOUNTER — Telehealth (INDEPENDENT_AMBULATORY_CARE_PROVIDER_SITE_OTHER): Payer: Self-pay

## 2013-07-31 DIAGNOSIS — E041 Nontoxic single thyroid nodule: Secondary | ICD-10-CM

## 2013-07-31 NOTE — Telephone Encounter (Signed)
Message copied by Illene Regulus on Wed Jul 31, 2013 11:43 AM ------      Message from: Adin Hector      Created: Thu Jul 25, 2013  7:55 AM       Please set up patient for ultrasound of neck with biopsy to evaluate right thyroid nodule ------

## 2013-07-31 NOTE — Telephone Encounter (Signed)
Called pt to notify her of the CT Chest showing a thyroid nodule and Dr Johney Maine is advising that we get a u/s with biopsy for further testing. I will place the order in epic and call her back with the appt. Dr Johney Maine wants this test done before her surgery on 08/13/13. The pt currently takes Synthroid prescribed by Dr Jeni Salles. The pt understands.

## 2013-07-31 NOTE — Telephone Encounter (Signed)
Place orders in epic

## 2013-08-07 ENCOUNTER — Ambulatory Visit
Admission: RE | Admit: 2013-08-07 | Discharge: 2013-08-07 | Disposition: A | Discharge status: Home / Self Care | Payer: BC Managed Care – PPO | Source: Ambulatory Visit | Attending: Surgery | Admitting: Surgery

## 2013-08-07 ENCOUNTER — Encounter (HOSPITAL_COMMUNITY): Payer: Self-pay | Admitting: Pharmacy Technician

## 2013-08-07 ENCOUNTER — Other Ambulatory Visit (HOSPITAL_COMMUNITY)
Admission: RE | Admit: 2013-08-07 | Discharge: 2013-08-07 | Disposition: A | Discharge status: Home / Self Care | Payer: BC Managed Care – PPO | Source: Ambulatory Visit | Attending: Interventional Radiology | Admitting: Interventional Radiology

## 2013-08-07 ENCOUNTER — Other Ambulatory Visit (INDEPENDENT_AMBULATORY_CARE_PROVIDER_SITE_OTHER): Payer: Self-pay | Admitting: Surgery

## 2013-08-07 DIAGNOSIS — E049 Nontoxic goiter, unspecified: Secondary | ICD-10-CM | POA: Insufficient documentation

## 2013-08-07 DIAGNOSIS — E041 Nontoxic single thyroid nodule: Secondary | ICD-10-CM

## 2013-08-08 ENCOUNTER — Encounter (HOSPITAL_COMMUNITY): Payer: Self-pay

## 2013-08-08 ENCOUNTER — Encounter (HOSPITAL_COMMUNITY)
Admission: RE | Admit: 2013-08-08 | Discharge: 2013-08-08 | Disposition: A | Discharge status: Home / Self Care | Payer: BC Managed Care – PPO | Source: Ambulatory Visit | Attending: Surgery | Admitting: Surgery

## 2013-08-08 DIAGNOSIS — Z01818 Encounter for other preprocedural examination: Secondary | ICD-10-CM | POA: Insufficient documentation

## 2013-08-08 DIAGNOSIS — Z01812 Encounter for preprocedural laboratory examination: Secondary | ICD-10-CM | POA: Insufficient documentation

## 2013-08-08 DIAGNOSIS — Z01810 Encounter for preprocedural cardiovascular examination: Secondary | ICD-10-CM | POA: Insufficient documentation

## 2013-08-08 HISTORY — DX: Hypothyroidism, unspecified: E03.9

## 2013-08-08 LAB — ABO/RH: ABO/RH(D): A POS

## 2013-08-08 LAB — BASIC METABOLIC PANEL
BUN: 12 mg/dL (ref 6–23)
CO2: 26 meq/L (ref 19–32)
Calcium: 9.7 mg/dL (ref 8.4–10.5)
Chloride: 102 mEq/L (ref 96–112)
Creatinine, Ser: 0.9 mg/dL (ref 0.50–1.10)
GFR calc Af Amer: 78 mL/min — ABNORMAL LOW (ref >90)
GFR, EST NON AFRICAN AMERICAN: 67 mL/min — AB (ref >90)
GLUCOSE: 107 mg/dL — AB (ref 70–99)
Potassium: 4.1 mEq/L (ref 3.7–5.3)
SODIUM: 140 meq/L (ref 137–147)

## 2013-08-08 LAB — CBC
HEMATOCRIT: 38.2 % (ref 36.0–46.0)
HEMOGLOBIN: 13.1 g/dL (ref 12.0–15.0)
MCH: 30.3 pg (ref 26.0–34.0)
MCHC: 34.3 g/dL (ref 30.0–36.0)
MCV: 88.4 fL (ref 78.0–100.0)
Platelets: 218 10*3/uL (ref 150–400)
RBC: 4.32 MIL/uL (ref 3.87–5.11)
RDW: 13.6 % (ref 11.5–15.5)
WBC: 4.9 10*3/uL (ref 4.0–10.5)

## 2013-08-08 NOTE — Pre-Procedure Instructions (Signed)
08-08-13 EKG done today. CT Chest 07-25-13 Epic.

## 2013-08-08 NOTE — Patient Instructions (Addendum)
[place]  08/08/2013   Your procedure is scheduled on:  2-3 -2015  Report to [facility] at    [place]    AM.  Call this number if you have problems the morning of surgery: [phone]  Or Presurgical Testing [phone](Elmon Shader) For Living Will and/or Health Care Power Attorney Forms: please provide copy for your medical record,may bring AM of surgery(Forms should be already notarized -we do not provide this service).  Remember: Follow any bowel prep instructions per MD office.  Do not  eat food:After Midnight.    Take these medicines the morning of surgery with A SIP OF WATER: Levothyroxine.   Do not wear jewelry, make-up or nail polish.  Do not wear lotions, powders, or perfumes. You may wear deodorant.  Do not shave 12 hours prior to first CHG shower(legs and under arms).(face and neck okay.)  Do not bring valuables to the hospital.(Hospital is not responsible for lost valuables).  Contacts, dentures or removable bridgework, body piercing, hair pins may not be worn into surgery.  Leave suitcase in the car. After surgery it may be brought to your room.  For patients admitted to the hospital, checkout time is 11:00 AM the day of discharge.(Restricted visitors-Persons, age 64 or younger - may not visit at this time.)    Patients discharged the day of surgery will not be allowed to drive home. Must have responsible person with you x 24 hours once discharged.  Name and phone number of your driver: Varsha Knock, friend- [phone] cell  Special Instructions: CHG(Chlorhedine 4%-"Hibiclens","Betasept","Aplicare") Shower Use Special Wash: see special instructions.(avoid face and genitals)   Please read over the following fact sheets that you were given:  Blood Transfusion fact sheet, Incentive Spirometry Instruction.  Remember : Type/Screen "Blue armbands" - may not be removed once applied(would result in being retested AM of surgery, if removed).  Failure to follow  these instructions may result in Cancellation of your surgery.   Patient signature_______________________________________________________

## 2013-08-12 ENCOUNTER — Telehealth (INDEPENDENT_AMBULATORY_CARE_PROVIDER_SITE_OTHER): Payer: Self-pay | Admitting: Surgery

## 2013-08-12 NOTE — Telephone Encounter (Signed)
Pt has sx in morning 08/13/13 w Dr Johney Maine has misplaced her instructions on her medications and pre surgery instructions  Please call and give her instructions if she does not answer per pt can leave detailed messaged with medications and times to take  [phone]

## 2013-08-12 NOTE — Anesthesia Preprocedure Evaluation (Addendum)
Anesthesia Evaluation  Patient identified by MRN, date of birth, ID band Patient awake    Reviewed: Allergy & Precautions, H&P , NPO status , Patient's Chart, lab work & pertinent test results  History of Anesthesia Complications (+) PONV and history of anesthetic complications  Airway Mallampati: II TM Distance: >3 FB Neck ROM: full    Dental  (+) Dental Advisory Given, Caps, Missing and Partial Upper,    Pulmonary neg pulmonary ROS,  breath sounds clear to auscultation  Pulmonary exam normal       Cardiovascular Exercise Tolerance: Good hypertension, Pt. on medications negative cardio ROS  Rhythm:regular Rate:Normal     Neuro/Psych negative neurological ROS  negative psych ROS   GI/Hepatic negative GI ROS, Neg liver ROS, Neoplasm Colon   Endo/Other  negative endocrine ROSHypothyroidism   Renal/GU negative Renal ROS  negative genitourinary   Musculoskeletal   Abdominal   Peds  Hematology negative hematology ROS (+)   Anesthesia Other Findings   Reproductive/Obstetrics negative OB ROS                          Anesthesia Physical Anesthesia Plan  ASA: III  Anesthesia Plan: General   Post-op Pain Management:    Induction: Intravenous  Airway Management Planned: Oral ETT  Additional Equipment:   Intra-op Plan:   Post-operative Plan: Extubation in OR  Informed Consent: I have reviewed the patients History and Physical, chart, labs and discussed the procedure including the risks, benefits and alternatives for the proposed anesthesia with the patient or authorized representative who has indicated his/her understanding and acceptance.   Dental advisory given  Plan Discussed with: CRNA  Anesthesia Plan Comments:         Anesthesia Quick Evaluation

## 2013-08-12 NOTE — Telephone Encounter (Signed)
Called pt to go over lower distal prep instructions over the phone for her to prep today for the surgery tomorrow. The pt understood all the directions.

## 2013-08-13 ENCOUNTER — Encounter (HOSPITAL_COMMUNITY): Payer: BC Managed Care – PPO | Admitting: Anesthesiology

## 2013-08-13 ENCOUNTER — Inpatient Hospital Stay (HOSPITAL_COMMUNITY)
Admission: RE | Admit: 2013-08-13 | Discharge: 2013-08-15 | DRG: 331 | Disposition: A | Discharge status: Home / Self Care | Payer: BC Managed Care – PPO | Source: Ambulatory Visit | Attending: Surgery | Admitting: Surgery

## 2013-08-13 ENCOUNTER — Encounter (HOSPITAL_COMMUNITY): Payer: Self-pay | Admitting: *Deleted

## 2013-08-13 ENCOUNTER — Ambulatory Visit (HOSPITAL_COMMUNITY): Payer: BC Managed Care – PPO | Admitting: Anesthesiology

## 2013-08-13 ENCOUNTER — Encounter (HOSPITAL_COMMUNITY)
Admission: RE | Disposition: A | Discharge status: Home / Self Care | Payer: Self-pay | Source: Ambulatory Visit | Attending: Surgery

## 2013-08-13 DIAGNOSIS — I10 Essential (primary) hypertension: Secondary | ICD-10-CM | POA: Diagnosis present

## 2013-08-13 DIAGNOSIS — E079 Disorder of thyroid, unspecified: Secondary | ICD-10-CM | POA: Diagnosis present

## 2013-08-13 DIAGNOSIS — K432 Incisional hernia without obstruction or gangrene: Secondary | ICD-10-CM

## 2013-08-13 DIAGNOSIS — Z01812 Encounter for preprocedural laboratory examination: Secondary | ICD-10-CM

## 2013-08-13 DIAGNOSIS — Z9089 Acquired absence of other organs: Secondary | ICD-10-CM

## 2013-08-13 DIAGNOSIS — C187 Malignant neoplasm of sigmoid colon: Principal | ICD-10-CM | POA: Diagnosis present

## 2013-08-13 DIAGNOSIS — E042 Nontoxic multinodular goiter: Secondary | ICD-10-CM | POA: Diagnosis present

## 2013-08-13 DIAGNOSIS — M25519 Pain in unspecified shoulder: Secondary | ICD-10-CM | POA: Diagnosis present

## 2013-08-13 DIAGNOSIS — C19 Malignant neoplasm of rectosigmoid junction: Secondary | ICD-10-CM

## 2013-08-13 DIAGNOSIS — D126 Benign neoplasm of colon, unspecified: Secondary | ICD-10-CM | POA: Diagnosis present

## 2013-08-13 DIAGNOSIS — K429 Umbilical hernia without obstruction or gangrene: Secondary | ICD-10-CM | POA: Diagnosis present

## 2013-08-13 LAB — TYPE AND SCREEN
ABO/RH(D): A POS
ANTIBODY SCREEN: NEGATIVE

## 2013-08-13 SURGERY — ROBOT ASSISTED LAPAROSCOPIC PARTIAL COLECTOMY
Anesthesia: General

## 2013-08-13 MED ORDER — DIPHENHYDRAMINE HCL 12.5 MG/5ML PO ELIX
12.5000 mg | ORAL_SOLUTION | Freq: Four times a day (QID) | ORAL | Status: DC | PRN
Start: 2013-08-13 — End: 2013-08-15

## 2013-08-13 MED ORDER — NEOSTIGMINE METHYLSULFATE 1 MG/ML IJ SOLN
INTRAMUSCULAR | Status: DC | PRN
  Administered 2013-08-13: 5 mg via INTRAVENOUS

## 2013-08-13 MED ORDER — DEXAMETHASONE SODIUM PHOSPHATE 10 MG/ML IJ SOLN
INTRAMUSCULAR | Status: DC | PRN
  Administered 2013-08-13: 10 mg via INTRAVENOUS

## 2013-08-13 MED ORDER — PHENYLEPHRINE HCL 10 MG/ML IJ SOLN
INTRAMUSCULAR | Status: DC | PRN
  Administered 2013-08-13 (×3): 80 ug via INTRAVENOUS

## 2013-08-13 MED ORDER — ROCURONIUM BROMIDE 100 MG/10ML IV SOLN
INTRAVENOUS | Status: DC | PRN
  Administered 2013-08-13: 10 mg via INTRAVENOUS
  Administered 2013-08-13 (×2): 20 mg via INTRAVENOUS
  Administered 2013-08-13: 50 mg via INTRAVENOUS

## 2013-08-13 MED ORDER — ALVIMOPAN 12 MG PO CAPS
12.0000 mg | ORAL_CAPSULE | Freq: Once | ORAL | Status: AC
  Administered 2013-08-13: 12 mg via ORAL
  Filled 2013-08-13: qty 1

## 2013-08-13 MED ORDER — ONDANSETRON HCL 4 MG/2ML IJ SOLN
INTRAMUSCULAR | Status: AC
  Filled 2013-08-13: qty 2

## 2013-08-13 MED ORDER — MIDAZOLAM HCL 5 MG/5ML IJ SOLN
INTRAMUSCULAR | Status: DC | PRN
  Administered 2013-08-13: 2 mg via INTRAVENOUS

## 2013-08-13 MED ORDER — LACTATED RINGERS IV SOLN
INTRAVENOUS | Status: DC | PRN
  Administered 2013-08-13 (×3): via INTRAVENOUS

## 2013-08-13 MED ORDER — ACETAMINOPHEN 500 MG PO TABS
1000.0000 mg | ORAL_TABLET | Freq: Three times a day (TID) | ORAL | Status: DC
  Administered 2013-08-13 – 2013-08-15 (×6): 1000 mg via ORAL
  Filled 2013-08-13 (×8): qty 2

## 2013-08-13 MED ORDER — FENTANYL CITRATE 0.05 MG/ML IJ SOLN
INTRAMUSCULAR | Status: AC
  Filled 2013-08-13: qty 5

## 2013-08-13 MED ORDER — SUCCINYLCHOLINE CHLORIDE 20 MG/ML IJ SOLN
INTRAMUSCULAR | Status: DC | PRN
  Administered 2013-08-13: 100 mg via INTRAVENOUS

## 2013-08-13 MED ORDER — ZOLPIDEM TARTRATE 5 MG PO TABS
5.0000 mg | ORAL_TABLET | Freq: Every evening | ORAL | Status: DC | PRN
  Administered 2013-08-14: 5 mg via ORAL
  Filled 2013-08-13: qty 1

## 2013-08-13 MED ORDER — PROPOFOL 10 MG/ML IV BOLUS
INTRAVENOUS | Status: DC | PRN
  Administered 2013-08-13: 200 mg via INTRAVENOUS

## 2013-08-13 MED ORDER — DEXTROSE 5 % IV SOLN
2.0000 g | INTRAVENOUS | Status: AC
  Administered 2013-08-13: 2 g via INTRAVENOUS

## 2013-08-13 MED ORDER — BUPIVACAINE 0.25 % ON-Q PUMP DUAL CATH 300 ML
300.0000 mL | INJECTION | Status: DC
  Administered 2013-08-13: 300 mL
  Filled 2013-08-13: qty 300

## 2013-08-13 MED ORDER — GLYCOPYRROLATE 0.2 MG/ML IJ SOLN
INTRAMUSCULAR | Status: AC
  Filled 2013-08-13: qty 3

## 2013-08-13 MED ORDER — VALACYCLOVIR HCL 500 MG PO TABS
1000.0000 mg | ORAL_TABLET | Freq: Every day | ORAL | Status: DC | PRN
  Filled 2013-08-13: qty 2

## 2013-08-13 MED ORDER — LACTATED RINGERS IV SOLN
INTRAVENOUS | Status: DC | PRN
  Administered 2013-08-13: 07:00:00 via INTRAVENOUS

## 2013-08-13 MED ORDER — ONDANSETRON HCL 4 MG/2ML IJ SOLN
INTRAMUSCULAR | Status: DC | PRN
  Administered 2013-08-13: 4 mg via INTRAVENOUS

## 2013-08-13 MED ORDER — CEFOTETAN DISODIUM-DEXTROSE 2-2.08 GM-% IV SOLR
INTRAVENOUS | Status: AC
  Filled 2013-08-13: qty 50

## 2013-08-13 MED ORDER — HYDROMORPHONE HCL PF 1 MG/ML IJ SOLN
0.5000 mg | INTRAMUSCULAR | Status: DC | PRN
  Administered 2013-08-13 – 2013-08-15 (×6): 1 mg via INTRAVENOUS
  Filled 2013-08-13 (×6): qty 1

## 2013-08-13 MED ORDER — HEPARIN SODIUM (PORCINE) 5000 UNIT/ML IJ SOLN
5000.0000 [IU] | Freq: Three times a day (TID) | INTRAMUSCULAR | Status: DC
  Administered 2013-08-14 – 2013-08-15 (×4): 5000 [IU] via SUBCUTANEOUS
  Filled 2013-08-13 (×7): qty 1

## 2013-08-13 MED ORDER — HEPARIN SODIUM (PORCINE) 5000 UNIT/ML IJ SOLN
5000.0000 [IU] | Freq: Once | INTRAMUSCULAR | Status: AC
  Administered 2013-08-13: 5000 [IU] via SUBCUTANEOUS
  Filled 2013-08-13: qty 1

## 2013-08-13 MED ORDER — VALSARTAN-HYDROCHLOROTHIAZIDE 160-12.5 MG PO TABS: 1.0000 | ORAL_TABLET | Freq: Every morning | ORAL | Status: DC

## 2013-08-13 MED ORDER — LACTATED RINGERS IV BOLUS (SEPSIS): 1000.0000 mL | Freq: Three times a day (TID) | INTRAVENOUS | Status: DC | PRN

## 2013-08-13 MED ORDER — PROPOFOL 10 MG/ML IV BOLUS
INTRAVENOUS | Status: AC
Start: 2013-08-13 — End: 2013-08-13
  Filled 2013-08-13: qty 20

## 2013-08-13 MED ORDER — BUPIVACAINE LIPOSOME 1.3 % IJ SUSP
20.0000 mL | INTRAMUSCULAR | Status: DC
  Filled 2013-08-13: qty 20

## 2013-08-13 MED ORDER — SODIUM CHLORIDE 0.9 % IV SOLN
INTRAVENOUS | Status: DC
  Filled 2013-08-13: qty 6

## 2013-08-13 MED ORDER — LIDOCAINE HCL (CARDIAC) 20 MG/ML IV SOLN
INTRAVENOUS | Status: DC | PRN
  Administered 2013-08-13: 100 mg via INTRAVENOUS

## 2013-08-13 MED ORDER — HYDROMORPHONE HCL PF 1 MG/ML IJ SOLN
0.2500 mg | INTRAMUSCULAR | Status: DC | PRN
  Administered 2013-08-13: 0.5 mg via INTRAVENOUS
  Administered 2013-08-13: 0.25 mg via INTRAVENOUS
  Administered 2013-08-13 (×2): 0.5 mg via INTRAVENOUS

## 2013-08-13 MED ORDER — LIDOCAINE HCL (CARDIAC) 20 MG/ML IV SOLN
INTRAVENOUS | Status: AC
  Filled 2013-08-13: qty 5

## 2013-08-13 MED ORDER — MENTHOL 3 MG MT LOZG
1.0000 | LOZENGE | OROMUCOSAL | Status: DC | PRN
  Filled 2013-08-13 (×3): qty 9

## 2013-08-13 MED ORDER — NEOSTIGMINE METHYLSULFATE 1 MG/ML IJ SOLN
INTRAMUSCULAR | Status: AC
  Filled 2013-08-13: qty 10

## 2013-08-13 MED ORDER — GLYCOPYRROLATE 0.2 MG/ML IJ SOLN
INTRAMUSCULAR | Status: DC | PRN
  Administered 2013-08-13: 0.6 mg via INTRAVENOUS

## 2013-08-13 MED ORDER — ALUM & MAG HYDROXIDE-SIMETH 200-200-20 MG/5ML PO SUSP: 30.0000 mL | Freq: Four times a day (QID) | ORAL | Status: DC | PRN

## 2013-08-13 MED ORDER — ADULT MULTIVITAMIN W/MINERALS CH
1.0000 | ORAL_TABLET | Freq: Every day | ORAL | Status: DC
  Filled 2013-08-13 (×4): qty 1

## 2013-08-13 MED ORDER — HYDROMORPHONE HCL PF 1 MG/ML IJ SOLN
INTRAMUSCULAR | Status: AC
  Filled 2013-08-13: qty 1

## 2013-08-13 MED ORDER — HYDROCHLOROTHIAZIDE 12.5 MG PO CAPS
12.5000 mg | ORAL_CAPSULE | Freq: Every day | ORAL | Status: DC
  Administered 2013-08-13 – 2013-08-15 (×3): 12.5 mg via ORAL
  Filled 2013-08-13 (×4): qty 1

## 2013-08-13 MED ORDER — DEXTROSE 5 % IV SOLN
2.0000 g | Freq: Two times a day (BID) | INTRAVENOUS | Status: AC
  Administered 2013-08-13: 2 g via INTRAVENOUS
  Filled 2013-08-13 (×2): qty 2

## 2013-08-13 MED ORDER — ALVIMOPAN 12 MG PO CAPS
12.0000 mg | ORAL_CAPSULE | Freq: Two times a day (BID) | ORAL | Status: DC
  Administered 2013-08-14 – 2013-08-15 (×3): 12 mg via ORAL
  Filled 2013-08-13 (×4): qty 1

## 2013-08-13 MED ORDER — BUPIVACAINE-EPINEPHRINE PF 0.25-1:200000 % IJ SOLN
INTRAMUSCULAR | Status: AC
  Filled 2013-08-13: qty 60

## 2013-08-13 MED ORDER — PROMETHAZINE HCL 25 MG/ML IJ SOLN: 6.2500 mg | INTRAMUSCULAR | Status: DC | PRN

## 2013-08-13 MED ORDER — ROCURONIUM BROMIDE 100 MG/10ML IV SOLN
INTRAVENOUS | Status: AC
  Filled 2013-08-13: qty 1

## 2013-08-13 MED ORDER — PHENYLEPHRINE HCL 10 MG/ML IJ SOLN
10.0000 mg | INTRAVENOUS | Status: DC | PRN
  Administered 2013-08-13: 50 ug/min via INTRAVENOUS

## 2013-08-13 MED ORDER — IRBESARTAN 150 MG PO TABS
150.0000 mg | ORAL_TABLET | Freq: Every day | ORAL | Status: DC
  Administered 2013-08-13 – 2013-08-15 (×3): 150 mg via ORAL
  Filled 2013-08-13 (×4): qty 1

## 2013-08-13 MED ORDER — MAGIC MOUTHWASH
15.0000 mL | Freq: Four times a day (QID) | ORAL | Status: DC | PRN
  Filled 2013-08-13: qty 15

## 2013-08-13 MED ORDER — BUPIVACAINE ON-Q PAIN PUMP (FOR ORDER SET NO CHG): INJECTION | Status: DC

## 2013-08-13 MED ORDER — PROMETHAZINE HCL 25 MG/ML IJ SOLN
6.2500 mg | Freq: Four times a day (QID) | INTRAMUSCULAR | Status: DC | PRN
  Administered 2013-08-13: 6.25 mg via INTRAVENOUS
  Filled 2013-08-13: qty 1

## 2013-08-13 MED ORDER — PHENOL 1.4 % MT LIQD
2.0000 | OROMUCOSAL | Status: DC | PRN
  Filled 2013-08-13: qty 177

## 2013-08-13 MED ORDER — HETASTARCH-ELECTROLYTES 6 % IV SOLN
INTRAVENOUS | Status: DC | PRN
  Administered 2013-08-13: 08:00:00 via INTRAVENOUS

## 2013-08-13 MED ORDER — DIPHENHYDRAMINE HCL 50 MG/ML IJ SOLN: 12.5000 mg | Freq: Four times a day (QID) | INTRAMUSCULAR | Status: DC | PRN

## 2013-08-13 MED ORDER — DEXAMETHASONE SODIUM PHOSPHATE 10 MG/ML IJ SOLN
INTRAMUSCULAR | Status: AC
  Filled 2013-08-13: qty 1

## 2013-08-13 MED ORDER — LIP MEDEX EX OINT
1.0000 "application " | TOPICAL_OINTMENT | Freq: Two times a day (BID) | CUTANEOUS | Status: DC
  Administered 2013-08-13 – 2013-08-14 (×4): 1 via TOPICAL
  Filled 2013-08-13 (×2): qty 7

## 2013-08-13 MED ORDER — LACTATED RINGERS IV SOLN: INTRAVENOUS | Status: DC

## 2013-08-13 MED ORDER — BUPIVACAINE 0.25 % ON-Q PUMP DUAL CATH 300 ML: 300.0000 mL | INJECTION | Status: DC

## 2013-08-13 MED ORDER — BUPIVACAINE-EPINEPHRINE PF 0.25-1:200000 % IJ SOLN
INTRAMUSCULAR | Status: AC
  Filled 2013-08-13: qty 30

## 2013-08-13 MED ORDER — METOPROLOL TARTRATE 1 MG/ML IV SOLN
5.0000 mg | Freq: Four times a day (QID) | INTRAVENOUS | Status: DC | PRN
  Filled 2013-08-13: qty 5

## 2013-08-13 MED ORDER — SACCHAROMYCES BOULARDII 250 MG PO CAPS
250.0000 mg | ORAL_CAPSULE | Freq: Two times a day (BID) | ORAL | Status: DC
  Administered 2013-08-13 – 2013-08-15 (×5): 250 mg via ORAL
  Filled 2013-08-13 (×6): qty 1

## 2013-08-13 MED ORDER — KCL IN DEXTROSE-NACL 40-5-0.9 MEQ/L-%-% IV SOLN
INTRAVENOUS | Status: DC
  Administered 2013-08-13: 16:00:00 via INTRAVENOUS
  Filled 2013-08-13 (×3): qty 1000

## 2013-08-13 MED ORDER — BUPIVACAINE-EPINEPHRINE 0.25% -1:200000 IJ SOLN
INTRAMUSCULAR | Status: DC | PRN
  Administered 2013-08-13: 70 mL

## 2013-08-13 MED ORDER — MIDAZOLAM HCL 2 MG/2ML IJ SOLN
INTRAMUSCULAR | Status: AC
  Filled 2013-08-13: qty 2

## 2013-08-13 MED ORDER — OXYCODONE HCL 5 MG PO TABS: 5.0000 mg | ORAL_TABLET | ORAL | Status: DC | PRN

## 2013-08-13 MED ORDER — FENTANYL CITRATE 0.05 MG/ML IJ SOLN
INTRAMUSCULAR | Status: DC | PRN
  Administered 2013-08-13 (×10): 50 ug via INTRAVENOUS

## 2013-08-13 MED ORDER — PHENYLEPHRINE HCL 10 MG/ML IJ SOLN
INTRAMUSCULAR | Status: AC
  Filled 2013-08-13: qty 1

## 2013-08-13 SURGICAL SUPPLY — 90 items
APPLIER CLIP 5 13 M/L LIGAMAX5 (MISCELLANEOUS)
APPLIER CLIP ROT 10 11.4 M/L (STAPLE)
APR CLP MED LRG 11.4X10 (STAPLE)
APR CLP MED LRG 5 ANG JAW (MISCELLANEOUS)
BLADE EXTENDED COATED 6.5IN (ELECTRODE) ×3 IMPLANT
BLADE HEX COATED 2.75 (ELECTRODE) ×6 IMPLANT
BLADE SURG SZ10 CARB STEEL (BLADE) ×3 IMPLANT
CABLE HIGH FREQUENCY MONO STRZ (ELECTRODE) ×5 IMPLANT
CANISTER SUCTION 2500CC (MISCELLANEOUS) ×1 IMPLANT
CATH KIT ON Q 7.5IN SLV (PAIN MANAGEMENT) ×4 IMPLANT
CELLS DAT CNTRL 66122 CELL SVR (MISCELLANEOUS) IMPLANT
CLIP APPLIE 5 13 M/L LIGAMAX5 (MISCELLANEOUS) IMPLANT
CLIP APPLIE ROT 10 11.4 M/L (STAPLE) IMPLANT
CLIP LIGATING HEM O LOK PURPLE (MISCELLANEOUS) IMPLANT
CLIP LIGATING HEMO O LOK GREEN (MISCELLANEOUS) IMPLANT
CLIP LIGATING HEMOLOK MED (MISCELLANEOUS) ×1 IMPLANT
CORDS BIPOLAR (ELECTRODE) ×3 IMPLANT
COUNTER NEEDLE 20 DBL MAG RED (NEEDLE) ×3 IMPLANT
COVER MAYO STAND STRL (DRAPES) ×4 IMPLANT
COVER TIP SHEARS 8 DVNC (MISCELLANEOUS) ×1 IMPLANT
COVER TIP SHEARS 8MM DA VINCI (MISCELLANEOUS) ×2
DECANTER SPIKE VIAL GLASS SM (MISCELLANEOUS) ×1 IMPLANT
DRAIN CHANNEL 19F RND (DRAIN) IMPLANT
DRAPE CAMERA CLOSED 9X96 (DRAPES) ×2 IMPLANT
DRAPE LAPAROSCOPIC ABDOMINAL (DRAPES) ×1 IMPLANT
DRAPE LG THREE QUARTER DISP (DRAPES) ×7 IMPLANT
DRAPE TABLE BACK 44X90 PK DISP (DRAPES) ×1 IMPLANT
DRAPE UTILITY XL STRL (DRAPES) ×7 IMPLANT
DRAPE WARM FLUID 44X44 (DRAPE) ×3 IMPLANT
DRSG OPSITE POSTOP 4X10 (GAUZE/BANDAGES/DRESSINGS) IMPLANT
DRSG OPSITE POSTOP 4X6 (GAUZE/BANDAGES/DRESSINGS) ×2 IMPLANT
DRSG OPSITE POSTOP 4X8 (GAUZE/BANDAGES/DRESSINGS) IMPLANT
DRSG TEGADERM 2-3/8X2-3/4 SM (GAUZE/BANDAGES/DRESSINGS) ×16 IMPLANT
DRSG TEGADERM 4X4.75 (GAUZE/BANDAGES/DRESSINGS) IMPLANT
ELECT REM PT RETURN 9FT ADLT (ELECTROSURGICAL) ×3
ELECTRODE REM PT RTRN 9FT ADLT (ELECTROSURGICAL) ×1 IMPLANT
ENDOLOOP SUT PDS II  0 18 (SUTURE)
ENDOLOOP SUT PDS II 0 18 (SUTURE) IMPLANT
GLOVE ECLIPSE 8.0 STRL XLNG CF (GLOVE) ×8 IMPLANT
GLOVE INDICATOR 8.0 STRL GRN (GLOVE) ×8 IMPLANT
GOWN STRL REUS W/TWL XL LVL3 (GOWN DISPOSABLE) ×16 IMPLANT
KIT ACCESSORY DA VINCI DISP (KITS) ×2
KIT ACCESSORY DVNC DISP (KITS) ×1 IMPLANT
KIT BASIN OR (CUSTOM PROCEDURE TRAY) ×5 IMPLANT
KIT PROCEDURE DA VINCI SI (MISCELLANEOUS)
KIT PROCEDURE DVNC SI (MISCELLANEOUS) IMPLANT
LEGGING LITHOTOMY PAIR STRL (DRAPES) ×7 IMPLANT
PACK EENT SPLIT (PACKS) ×2 IMPLANT
PACK UNIVERSAL I (CUSTOM PROCEDURE TRAY) ×2 IMPLANT
PENCIL BUTTON HOLSTER BLD 10FT (ELECTRODE) ×6 IMPLANT
PUMP PAIN ON-Q (MISCELLANEOUS) IMPLANT
RETRACTOR WND ALEXIS 18 MED (MISCELLANEOUS) IMPLANT
RTRCTR WOUND ALEXIS 18CM MED (MISCELLANEOUS)
SCISSORS LAP 5X35 DISP (ENDOMECHANICALS) ×2 IMPLANT
SEALER TISSUE G2 STRG ARTC 35C (ENDOMECHANICALS) IMPLANT
SET IRRIG TUBING LAPAROSCOPIC (IRRIGATION / IRRIGATOR) IMPLANT
SET TUBE IRRIG SUCTION NO TIP (IRRIGATION / IRRIGATOR) ×3 IMPLANT
SLEEVE XCEL OPT CAN 5 100 (ENDOMECHANICALS) IMPLANT
SOLUTION ELECTROLUBE (MISCELLANEOUS) ×3 IMPLANT
SPONGE GAUZE 4X4 12PLY (GAUZE/BANDAGES/DRESSINGS) ×3 IMPLANT
SPONGE LAP 18X18 X RAY DECT (DISPOSABLE) ×6 IMPLANT
STAPLER CIRC CVD 29MM 37CM (STAPLE) ×2 IMPLANT
STAPLER CUT CVD 40MM BLUE (STAPLE) ×2 IMPLANT
SUCTION POOLE TIP (SUCTIONS) ×3 IMPLANT
SUT MNCRL AB 4-0 PS2 18 (SUTURE) ×5 IMPLANT
SUT PDS AB 1 CTX 36 (SUTURE) ×4 IMPLANT
SUT PROLENE 0 CT 2 (SUTURE) IMPLANT
SUT SILK 2 0 (SUTURE) ×3
SUT SILK 2 0 SH CR/8 (SUTURE) ×3 IMPLANT
SUT SILK 2-0 18XBRD TIE 12 (SUTURE) ×1 IMPLANT
SUT SILK 3 0 (SUTURE)
SUT SILK 3 0 SH CR/8 (SUTURE) ×1 IMPLANT
SUT SILK 3-0 18XBRD TIE 12 (SUTURE) ×1 IMPLANT
SYR 20CC LL (SYRINGE) ×4 IMPLANT
SYR BULB IRRIGATION 50ML (SYRINGE) ×3 IMPLANT
SYR CONTROL 10ML LL (SYRINGE) ×2 IMPLANT
SYS LAPSCP GELPORT 120MM (MISCELLANEOUS) ×3
SYSTEM LAPSCP GELPORT 120MM (MISCELLANEOUS) IMPLANT
TAPE UMBILICAL COTTON 1/8X30 (MISCELLANEOUS) IMPLANT
TOWEL OR 17X26 10 PK STRL BLUE (TOWEL DISPOSABLE) ×6 IMPLANT
TOWEL OR NON WOVEN STRL DISP B (DISPOSABLE) ×6 IMPLANT
TRAY FOLEY CATH 14FRSI W/METER (CATHETERS) ×3 IMPLANT
TRAY LAP CHOLE (CUSTOM PROCEDURE TRAY) ×1 IMPLANT
TROCAR 12M 150ML BLUNT (TROCAR) ×3 IMPLANT
TROCAR BLADELESS OPT 5 100 (ENDOMECHANICALS) ×3 IMPLANT
TUBING CONNECTING 10 (TUBING) IMPLANT
TUBING CONNECTING 10' (TUBING)
TUBING FILTER THERMOFLATOR (ELECTROSURGICAL) ×3 IMPLANT
TUNNELER SHEATH ON-Q 16GX12 DP (PAIN MANAGEMENT) ×2 IMPLANT
YANKAUER SUCT BULB TIP 10FT TU (MISCELLANEOUS) ×6 IMPLANT

## 2013-08-13 NOTE — Transfer of Care (Signed)
Immediate Anesthesia Transfer of Care Note  Patient: Christine Shepherd  Procedure(s) Performed: Procedure(s) (LRB): ROBOT ASSISTED LAPAROSCOPIC SIGMOID COLECTOMY (N/A)  Patient Location: PACU  Anesthesia Type: General  Level of Consciousness: sedated, patient cooperative and responds to stimulation  Airway & Oxygen Therapy: Patient Spontanous Breathing and Patient connected to face mask oxgen  Post-op Assessment: Report given to PACU RN and Post -op Vital signs reviewed and stable  Post vital signs: Reviewed and stable  Complications: No apparent anesthesia complications

## 2013-08-13 NOTE — H&P (View-Only) (Signed)
Subjective:     Patient ID: Christine Shepherd, female   DOB: [birth year], 63 y.o.   MRN: [id number]  HPI  Note: This dictation was prepared with Dragon/digital dictation along with [employer]. Any transcriptional errors that result from this process are unintentional.       Christine Shepherd  27-Feb-1951 [id number]  Patient Care Team: Criselda Peaches, MD as PCP - General (Internal Medicine) Fara Chute, PA-C as Physician Assistant (Physician Assistant) Adin Hector, MD as Consulting Physician (General Surgery) Lafayette Dragon, MD as Consulting Physician (Gastroenterology)  This patient is a 63 y.o.female who presents today for surgical evaluation at the request of Dr. Olevia Perches.   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  Concerned for need for colectomy.  Pleasant woman.  I perform cholecystectomy for her for chronic cholecystitis last year.  She recovered from that well.  Had a colonoscopy 10 years ago.  Question of polyps.  Biopsies came back as benign mucosa.  Ten-year followup denotes more polyps.  Larger one in the sigmoid colon removed piecemeal with bleeding.  Clip placed.  Pathology came back with a 4 mm focus of adenocarcinoma.  Polyp in pieces.  Cancer at the cauterized margin.  Hard to tell if margins definitely negative but maybe not.  Therefore, surgical consultation requested.  The patient walks 2 miles without difficulty.  No cardiopulmonary issues.  Bowel movement every day.  No personal nor family history of GI/colon cancer, inflammatory bowel disease, irritable bowel syndrome, allergy such as Celiac Sprue, dietary/dairy problems, colitis, ulcers nor gastritis.  No recent sick contacts/gastroenteritis.  No travel outside the country.  No changes in diet.    Patient Active Problem List   Diagnosis Date Noted  . Cancer of proximal sigmoid colon 07/17/2013  . Hypertension   . Thyroid disease     Past  Medical History  Diagnosis Date  . Hypertension   . Thyroid disease   . Gallstones   . Acute cholecystitis with chronic cholecystitis s/p lap chole [id number] 11/15/2011    Past Surgical History  Procedure Laterality Date  . Abdominal hysterectomy    . Dilation and curettage of uterus    . Tubal ligation    . Cholecystectomy  11/17/11    History   Social History  . Marital Status: Divorced    Spouse Name: N/A    Number of Children: N/A  . Years of Education: N/A   Occupational History  . Not on file.   Social History Main Topics  . Smoking status: Never Smoker   . Smokeless tobacco: Never Used  . Alcohol Use: No  . Drug Use: No  . Sexual Activity: Not on file   Other Topics Concern  . Not on file   Social History Narrative  . No narrative on file    Family History  Problem Relation Age of Onset  . Cancer Father     Prostate Ca  . Cancer Brother     unknown  . Colon cancer Neg Hx     Current Outpatient Prescriptions  Medication Sig Dispense Refill  . estradiol (VIVELLE-DOT) 0.05 MG/24HR Place 1 patch onto the skin 2 (two) times a week.      [name] ibuprofen (ADVIL,MOTRIN) 200 MG tablet Take 200 mg by mouth every 6 (six) hours as needed. Pain      . levothyroxine (SYNTHROID, LEVOTHROID) 75 MCG tablet Take 75 mcg by mouth daily with breakfast.       .  Multiple Vitamin (MULITIVITAMIN WITH MINERALS) TABS Take 1 tablet by mouth daily.      . valACYclovir (VALTREX) 1000 MG tablet       . valsartan-hydrochlorothiazide (DIOVAN-HCT) 160-12.5 MG per tablet Take 1 tablet by mouth daily.       No current facility-administered medications for this visit.     No Known Allergies  BP 166/90  Pulse 96  Temp(Src) 98.9 F (37.2 C) (Temporal)  Resp 15  Ht 5\' 6"  (1.676 m)  Wt 168 lb 9.6 oz (76.476 kg)  BMI 27.23 kg/m2  Ct Abdomen Pelvis W Contrast  07/03/2013   CLINICAL DATA:  Colon cancer  EXAM: CT ABDOMEN AND PELVIS WITH CONTRAST  TECHNIQUE: Multidetector CT imaging of  the abdomen and pelvis was performed using the standard protocol following bolus administration of intravenous contrast.  CONTRAST:  166mL OMNIPAQUE IOHEXOL 300 MG/ML  SOLN  COMPARISON:  None.  FINDINGS: Lung bases appear clear.  No pleural or pericardial effusion.  There is no focal liver abnormality identified. Previous coli cystectomy. Normal appearance of the pancreas. The spleen is negative.  The adrenal glands are both normal. Normal appearance of both kidneys. The urinary bladder appears normal. Previous hysterectomy.  There is mild calcified atherosclerotic disease affecting the abdominal aorta. No aneurysm identified. No enlarged mesenteric lymph nodes identified. No retroperitoneal adenopathy noted. There is no pelvic or inguinal adenopathy identified.  The stomach appears normal. The small bowel loops have a normal course and caliber without evidence for obstruction. The proximal colon appears normal. Endoclip is identified at the biopsy site within the sigmoid colon, image 55/series 2. There is mild circumferential wall thickening of the colon at this level. The mid and distal sigmoid colon appear normal. The rectum is unremarkable.  Review of the visualized osseous structures shows mild multi level lumbar spondylosis. No aggressive lytic or sclerotic bone lesions identified. The patient has a supraumbilical hernia which contains fat only.  IMPRESSION: 1. No acute findings and no evidence for metastatic disease. 2. Endoclip identified within the proximal sigmoid colon. There is associated, nonspecific mild wall thickening of the colon at this level.   Electronically Signed   By: Kerby Moors M.D.   On: 07/03/2013 11:24     Review of Systems  Constitutional: Negative for fever, chills, diaphoresis, appetite change and fatigue.  HENT: Negative for ear discharge, ear pain, sore throat and trouble swallowing.   Eyes: Negative for photophobia, discharge and visual disturbance.  Respiratory: Negative  for cough, choking, chest tightness and shortness of breath.   Cardiovascular: Negative for chest pain and palpitations.  Gastrointestinal: Negative for nausea, vomiting, abdominal pain, diarrhea, constipation, anal bleeding and rectal pain.  Endocrine: Negative for cold intolerance and heat intolerance.  Genitourinary: Negative for dysuria, frequency and difficulty urinating.  Musculoskeletal: Negative for gait problem, myalgias and neck pain.  Skin: Negative for color change, pallor and rash.  Allergic/Immunologic: Negative for environmental allergies, food allergies and immunocompromised state.  Neurological: Negative for dizziness, speech difficulty, weakness and numbness.  Hematological: Negative for adenopathy.  Psychiatric/Behavioral: Negative for confusion and agitation. The patient is not nervous/anxious.        Objective:   Physical Exam  Constitutional: She is oriented to person, place, and time. She appears well-developed and well-nourished. No distress.  HENT:  Head: Normocephalic.  Mouth/Throat: Oropharynx is clear and moist. No oropharyngeal exudate.  Eyes: Conjunctivae and EOM are normal. Pupils are equal, round, and reactive to light. No scleral icterus.  Neck: Normal range  of motion. Neck supple. No tracheal deviation present.  Cardiovascular: Normal rate, regular rhythm and intact distal pulses.   Pulmonary/Chest: Effort normal and breath sounds normal. No stridor. No respiratory distress. She exhibits no tenderness.  Abdominal: Soft. She exhibits no distension and no mass. There is no tenderness. Hernia confirmed negative in the right inguinal area and confirmed negative in the left inguinal area.  Genitourinary: No vaginal discharge found.  Musculoskeletal: Normal range of motion. She exhibits no tenderness.       Right elbow: She exhibits normal range of motion.       Left elbow: She exhibits normal range of motion.       Right wrist: She exhibits normal range of  motion.       Left wrist: She exhibits normal range of motion.       Right hand: Normal strength noted.       Left hand: Normal strength noted.  Lymphadenopathy:       Head (right side): No posterior auricular adenopathy present.       Head (left side): No posterior auricular adenopathy present.    She has no cervical adenopathy.    She has no axillary adenopathy.       Right: No inguinal adenopathy present.       Left: No inguinal adenopathy present.  Neurological: She is alert and oriented to person, place, and time. No cranial nerve deficit. She exhibits normal muscle tone. Coordination normal.  Skin: Skin is warm and dry. No rash noted. She is not diaphoretic. No erythema.  Psychiatric: She has a normal mood and affect. Her behavior is normal. Judgment and thought content normal.       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 Marcello Moores.  I called and discussed with the patient's gastroenterologist, Dr. Olevia Perches.  I had a long discussion with the patient and her significant other, reviewing films and discussing with audiovisual AIDS.  Given the fact that she has cancer within the polyp with an equivocal margin and at least S. And one staging, most aggressive option and a healthy active woman would be to do segmental colectomy to make sure no remaining tumor is involved and lymph nodes or negative.  Another option is to just do aggressive endoscopic monitoring.  However, Dr. Olevia Perches is concerned that the location is too tortuous that monitoring is difficult for accuracy.  I have asked Dr. Olevia Perches to tattoo the scar to make sure we know where the problem is.  At this point, I recommend minimally invasive sigmoid resection as the best chance at accurate staging and potential cure.  She wishes to be aggressive.  Good  candidate for minimally invasive approach.  Robotic vs. Laparoscopic:  The anatomy & physiology of the digestive tract was discussed.  The pathophysiology was discussed.  Natural history risks without surgery was discussed.   I feel the risks of no intervention will lead to serious problems that outweigh the operative risks; therefore, I recommended a partial colectomy to remove the pathology.  Laparoscopic & open techniques were discussed.   Risks such as bleeding, infection, abscess, leak, reoperation, possible ostomy, hernia, heart attack, death, and other risks were discussed.  I noted a good likelihood this will help address the problem.   Goals of post-operative  recovery were discussed as well.  We will work to minimize complications.  An educational handout on the pathology was given as well.  Questions were answered.  The patient expresses understanding & wishes to proceed with surgery.

## 2013-08-13 NOTE — Anesthesia Postprocedure Evaluation (Signed)
  Anesthesia Post-op Note  Patient: Christine Shepherd  Procedure(s) Performed: Procedure(s) (LRB): ROBOT ASSISTED LAPAROSCOPIC SIGMOID COLECTOMY (N/A)  Patient Location: PACU  Anesthesia Type: General  Level of Consciousness: awake and alert   Airway and Oxygen Therapy: Patient Spontanous Breathing  Post-op Pain: mild  Post-op Assessment: Post-op Vital signs reviewed, Patient's Cardiovascular Status Stable, Respiratory Function Stable, Patent Airway and No signs of Nausea or vomiting  Last Vitals:  Filed Vitals:   08/13/13 1415  BP: 172/72  Pulse: 73  Temp: 36.4 C  Resp: 14    Post-op Vital Signs: stable   Complications: No apparent anesthesia complications

## 2013-08-13 NOTE — Interval H&P Note (Signed)
History and Physical Interval Note:  08/13/2013 6:44 AM  Christine Shepherd  has presented today for surgery, with the diagnosis of adenocarcinoma with a polyp of proximal sigmoid colon  The various methods of treatment have been discussed with the patient and family. After consideration of risks, benefits and other options for treatment, the patient has consented to  Procedure(s): ROBOT ASSISTED LAPAROSCOPIC PARTIAL COLECTOMY (N/A) as a surgical intervention .  The patient's history has been reviewed, patient examined, no change in status, stable for surgery.  I have reviewed the patient's chart and labs.  Questions were answered to the patient's satisfaction.    Thyroid enlargement & maases.  Bx c/w colloid nodules & no definite atypia/cancer.  Hold off on thyroidectomy & follow.  Will see if pt open to endocrinology eval to follow & make sure no new nodules & masses stay stable.  If enlarging or causing Sx, consider thyroidectomy at a later time.   [name] C.

## 2013-08-13 NOTE — Op Note (Signed)
08/13/2013  12:29 PM  PATIENT:  Christine Shepherd  63 y.o. 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  POST-OPERATIVE DIAGNOSIS:    Adenocarcinoma within a polyp of proximal sigmoid colon Incisional VWH Umbilical hernia  PROCEDURE:  Procedure(s): ROBOT ASSISTED LAPAROSCOPIC SIGMOID COLECTOMY RIGID PROCTOSCOPY LAP LYSIS OF ADHESIONS PRIMARY REPAIR OF VENTRAL WALL HERNIA X1 PRIMARY REPAIR OF UMBILICAL HERNIA  SURGEON:  Surgeon(s): Adin Hector, MD Leighton Ruff, MD Asst  ANESTHESIA:   local and general  EBL:  Total I/O In: 3500 [I.V.:3000; IV Piggyback:500] Out: 250 [Urine:200; Blood:50]  Delay start of Pharmacological VTE agent (>24hrs) due to surgical blood loss or risk of bleeding:  no  DRAINS: none   SPECIMEN:  Source of Specimen:  Rectosigmoid (open end proximal) with anastomotic rings  DISPOSITION OF SPECIMEN:  PATHOLOGY  COUNTS:  YES  PLAN OF CARE: Admit to inpatient   PATIENT DISPOSITION:  PACU - hemodynamically stable.  INDICATION:    Pleasant woman with cancer at sigmoid junction.  Cancer persistent status post polypectomy.  I recommended segmental resection:  The anatomy & physiology of the digestive tract was discussed.  The pathophysiology was discussed.  Natural history risks without surgery was discussed.   I worked to give an overview of the disease and the frequent need to have multispecialty involvement.  I feel the risks of no intervention will lead to serious problems that outweigh the operative risks; therefore, I recommended a partial colectomy to remove the pathology.  Laparoscopic & open techniques were discussed.   Risks such as  bleeding, infection, abscess, leak, reoperation, possible ostomy, hernia, heart attack, death, and other risks were discussed.  I noted a good likelihood this will help address the problem.   Goals of post-operative recovery were discussed as well.  We will work to minimize complications.  Educational materials on the pathology had been given in the office.  Questions were answered.    The patient expressed understanding & wished to proceed with surgery.  OR FINDINGS:   Patient had Tattooing at the descending/sigmoid colon junction.  Irregularity in the mucosa consistent with persistent disease.  No major bulky lymphadenopathy  No obvious metastatic disease on visceral parietal peritoneum or liver.  The anastomosis rests 16 cm from the anal verge by rigid proctoscopy.  DESCRIPTION:   Informed consent was confirmed.  The patient underwent general anaesthesia without difficulty.  The patient was positioned appropriately.  VTE prevention in place.  The patient's abdomen was clipped, prepped, & draped in a sterile fashion.  Surgical timeout confirmed our plan.  The patient was positioned in reverse Trendelenburg.  Abdominal entry was gained using optical entry technique in the right upper abdomen.  Entry was clean.  I induced carbon dioxide insufflation.  Camera inspection revealed no injury.  Extra ports were carefully placed under direct laparoscopic visualization.  I reflected the greater omentum and the upper abdomen the small bowel in the upper abdomen. I scored the base of peritoneum of the right side of the mesentery of the left colon from the ligament of Treitz to the peritoneal reflection of the mid rectum.  I elevated the sigmoid mesentery and enetered into the retro-mesenteric plane.  We were able to identify the left  ureter and gonadal vessels. We kept those posterior within the retroperitoneum and elevated the left colon mesentery off that.  Debridement colon mesentery off the ligament of  Treitz.  Identified and skeletonized the inferior mesenteric vein.  I ligated using plastic Wexner clips.  I did isolated IMA pedicle but did not ligate it yet.  I continued distally and got into the avascular plane posterior to the mesorectum. This allowed me to help mobilize the rectum as well by freeing the mesorectum off the sacrum in the presacral plane.  I mobilized the peritoneal coverings towards the peritoneal reflection on both the right and left sides of the rectum.  I could see the right and left ureters and stayed away from them.  I kept the lateral vascular pedicles to the rectum intact.  I skeletonized the lymph nodes off the inferior mesenteric artery pedicle.  I went down to 1cm from its takeoff from the aorta.  I was able to find the left colic artery and superior hemorrhoidal artery.  There was an extra side branch between those 2 structures.I isolated the inferior mesenteric vein off of the ligament of Treitz just cephalad to that as well.  After confirming the left ureter was out of the way, I went ahead and ligated the inferior mesenteric artery pedicle And the left colic artery branch using Wexner clips near its takeoff from the aorta.  We ensured hemostasis I mobilized the left colon in a lateral to medial fashion off the line of Toldt up towards the splenic flexure to ensure good mobilization of the left colon to reach into the pelvis.  I skeletonized the mesorectum at the junction at the proximal rectum using blunt dissection & cautery scissors & occasional bipolar.   I radially transected the mid left colon mesentery just above the inferior mesenteric vein toward the mid descending colon to have good proximal margin as well.  I placed a GelPort as a wound protector through a Pfannenstiel incision in the suprapubic region, taking care to avoid bladder injury. I transected bowel at the proximal rectum using a contour stapler. I was able to eviscerate the rectosigmoid and descending  colon out the wound.  I chose a region at the descending/sigmoid junction that was soft and easily reached down. I clamped the colon proximal to this area using a soft bowel clamp. I transected at the descending/sigmoid junction with a scalpel. I got healthy bleeding mucosa. I transected the remaining specimen mesentery in a radial fashion to preserve good blood supply to the proximal colon end.  We sent the rectosigmoid colon specimen off to go to pathology.  We sized the colon orifice.  I chose a 29 EEA anvil stapler system. I placed the anvil to the open end of the descending colon and closed around it using a 0 Prolene pursestring.  We did copious irrigation with crystalloid solution.  Hemostasis was good.  The distal end of the colon at the handle easily reached down to the rectal stump, therefore, splenic flexure mobilization was not needed.      Dr Marcello Moores scrubbed down and did gentle anal dilation and advanced the EEA stapler up the rectal stump. The spike was brought out at the provimal end of the rectal stump under direct visualization.  I attached the anvil of the proximal colon the spike of the stapler. Anvil was tightened down and held clamped for 60 seconds. The EEA stapler was fired and held clamped for 30 seconds. The stapler was released & removed.  We noted 2 excellent anastomotic rings. Blue stitch is in the proximal ring.  Dr. Marcello Moores did rigid proctoscopy noted the anastomosis was at 16 cm from the anal verge consistent with the proximal rectum.  We did a final irrigation of antibiotic solution (900 mg clindamycin/240 mg gentamicin in a liter of crystalloid) & held that for the pelvic air leak test .  The rectum was insufflated the rectum while clamping the colon proximal to that anastomosis.  There was a negative air leak test. There was no tension of mesentery or bowel at the anastomosis.   Tissues looked viable.    We changed gloves.  We did diagnostic laparoscopy.  We aspirated the  antibiotic irrigation.  Hemostasis was good.   Ureters & bowel uninjured.  The anastomosis looked healthy.  I closed the infraumbilical 47mm robotic ports using 0 Vicryl using a laparoscopic suture passer.  I primarily fixed the supraumbilical and umbilical hernias with interrupted 0 Vicryl suture as well.   We changed gown and gloves.  The patient was re-draped.  Sterile unused instruments were used from this point out per colon SSI prevention protocol.   I placed On-Q catheter and sheaths into the preperitoneal space under direct palpation.  I removed CO2 gas out through the ports.  I closed the 36mm port sites using Monocryl stitch and sterile dressing.  I closed the Pfannenstiel wound using a 0 Vicryl vertical peritoneal closure and a #1 PDS transverse anterior rectal fascial closure. I closed the skin with some interrupted Monocryl stitches. I placed antibiotic-soaked wicks into the closure at the corners & centrally x3 between those areas. I placed a sterile dressing.  OnQ  catheters placed & sheaths peeled away.  Patient is being extubated go to recovery room. I discussed postop care with the patient in detail the office & in the holding area. Instructions are written. I updated the patient's status to the family.  Recommendations were made.  Questions were answered.  The family expressed understanding & appreciation.

## 2013-08-13 NOTE — Preoperative (Signed)
Beta Blockers   Reason not to administer Beta Blockers:Not Applicable 

## 2013-08-13 NOTE — Discharge Instructions (Signed)
ABDOMINAL SURGERY: POST OP INSTRUCTIONS ° °1. DIET: Follow a light bland diet the first 24 hours after arrival home, such as soup, liquids, crackers, etc.  Be sure to include lots of fluids daily.  Avoid fast food or heavy meals as your are more likely to get nauseated.  Eat a low fat the next few days after surgery.   °2. Take your usually prescribed home medications unless otherwise directed. °3. PAIN CONTROL: °a. Pain is best controlled by a usual combination of three different methods TOGETHER: °i. Ice/Heat °ii. Over the counter pain medication °iii. Prescription pain medication °b. Most patients will experience some swelling and bruising around the incisions.  Ice packs or heating pads (30-60 minutes up to 6 times a day) will help. Use ice for the first few days to help decrease swelling and bruising, then switch to heat to help relax tight/sore spots and speed recovery.  Some people prefer to use ice alone, heat alone, alternating between ice & heat.  Experiment to what works for you.  Swelling and bruising can take several weeks to resolve.   °c. It is helpful to take an over-the-counter pain medication regularly for the first few weeks.  Choose one of the following that works best for you: °i. Naproxen (Aleve, etc)  Two 220mg tabs twice a day °ii. Ibuprofen (Advil, etc) Three 200mg tabs four times a day (every meal & bedtime) °iii. Acetaminophen (Tylenol, etc) 500-650mg four times a day (every meal & bedtime) °d. A  prescription for pain medication (such as oxycodone, hydrocodone, etc) should be given to you upon discharge.  Take your pain medication as prescribed.  °i. If you are having problems/concerns with the prescription medicine (does not control pain, nausea, vomiting, rash, itching, etc), please call us [phone] to see if we need to switch you to a different pain medicine that will work better for you and/or control your side effect better. °ii. If you need a refill on your pain medication,  please contact your pharmacy.  They will contact our office to request authorization. Prescriptions will not be filled after 5 pm or on week-ends. °4. Avoid getting constipated.  Between the surgery and the pain medications, it is common to experience some constipation.  Increasing fluid intake and taking a fiber supplement (such as Metamucil, Citrucel, FiberCon, MiraLax, etc) 1-2 times a day regularly will usually help prevent this problem from occurring.  A mild laxative (prune juice, Milk of Magnesia, MiraLax, etc) should be taken according to package directions if there are no bowel movements after 48 hours.   °5. Watch out for diarrhea.  If you have many loose bowel movements, simplify your diet to bland foods & liquids for a few days.  Stop any stool softeners and decrease your fiber supplement.  Switching to mild anti-diarrheal medications (Kayopectate, Pepto Bismol) can help.  If this worsens or does not improve, please call us. °6. Wash / shower every day.  You may shower over the incision / wound.  Avoid baths until the skin is fully healed.  Continue to shower over incision(s) after the dressing is off. °7. Remove your waterproof bandages 5 days after surgery.  You may leave the incision open to air.  You may replace a dressing/Band-Aid to cover the incision for comfort if you wish. °8. ACTIVITIES as tolerated:   °a. You may resume regular (light) daily activities beginning the next day--such as daily self-care, walking, climbing stairs--gradually increasing activities as tolerated.  If you can   walk 30 minutes without difficulty, it is safe to try more intense activity such as jogging, treadmill, bicycling, low-impact aerobics, swimming, etc. °b. Save the most intensive and strenuous activity for last such as sit-ups, heavy lifting, contact sports, etc  Refrain from any heavy lifting or straining until you are off narcotics for pain control.   °c. DO NOT PUSH THROUGH PAIN.  Let pain be your guide: If it  hurts to do something, don't do it.  Pain is your body warning you to avoid that activity for another week until the pain goes down. °d. You may drive when you are no longer taking prescription pain medication, you can comfortably wear a seatbelt, and you can safely maneuver your car and apply brakes. °e. You may have sexual intercourse when it is comfortable.  °9. FOLLOW UP in our office °a. Please call [facility] at [phone] to set up an appointment to see your surgeon in the office for a follow-up appointment approximately 1-2 weeks after your surgery. °b. Make sure that you call for this appointment the day you arrive home to insure a convenient appointment time. °10. IF YOU HAVE DISABILITY OR FAMILY LEAVE FORMS, BRING THEM TO THE OFFICE FOR PROCESSING.  DO NOT GIVE THEM TO YOUR DOCTOR. ° ° °WHEN TO CALL US [phone]: °1. Poor pain control °2. Reactions / problems with new medications (rash/itching, nausea, etc)  °3. Fever over 101.5 F (38.5 C) °4. Inability to urinate °5. Nausea and/or vomiting °6. Worsening swelling or bruising °7. Continued bleeding from incision. °8. Increased pain, redness, or drainage from the incision ° °The clinic staff is available to answer your questions during regular business hours (8:30am-5pm).  Please don’t hesitate to call and ask to speak to one of our nurses for clinical concerns.   A surgeon from [facility] is always on call at the hospitals °  °If you have a medical emergency, go to the nearest emergency room or call 911. °  ° °[facility] °[address], [address], [place], [place]  [address] ? °MAIN: [phone] ? TOLL FREE: [phone] ? °FAX [phone] °[email or web address] ° °GETTING TO GOOD BOWEL HEALTH. °Irregular bowel habits such as constipation and diarrhea can lead to many problems over time.  Having one soft bowel movement a day is the most important way to prevent further problems.  The anorectal canal  is designed to handle stretching and feces to safely manage our ability to get rid of solid waste (feces, poop, stool) out of our body.  BUT, hard constipated stools can act like ripping concrete bricks and diarrhea can be a burning fire to this very sensitive area of our body, causing inflamed hemorrhoids, anal fissures, increasing risk is perirectal abscesses, abdominal pain/bloating, an making irritable bowel worse.     °The goal: ONE SOFT BOWEL MOVEMENT A DAY!  To have soft, regular bowel movements:  °  Drink at least 8 tall glasses of water a day.   °  Take plenty of fiber.  Fiber is the undigested part of plant food that passes into the colon, acting s “natures broom” to encourage bowel motility and movement.  Fiber can absorb and hold large amounts of water. This results in a larger, bulkier stool, which is soft and easier to pass. Work gradually over several weeks up to 6 servings a day of fiber (25g a day even more if needed) in the form of: °o Vegetables -- Root (potatoes, carrots, turnips),   leafy green (lettuce, salad greens, celery, spinach), or cooked high residue (cabbage, broccoli, etc) °o Fruit -- Fresh (unpeeled skin & pulp), Dried (prunes, apricots, cherries, etc ),  or stewed ( applesauce)  °o Whole grain breads, pasta, etc (whole wheat)  °o Bran cereals  °  Bulking Agents -- This type of water-retaining fiber generally is easily obtained each day by one of the following:  °o Psyllium bran -- The psyllium plant is remarkable because its ground seeds can retain so much water. This product is available as Metamucil, Konsyl, Effersyllium, Per Diem Fiber, or the less expensive generic preparation in drug and health food stores. Although labeled a laxative, it really is not a laxative.  °o Methylcellulose -- This is another fiber derived from wood which also retains water. It is available as Citrucel. °o Polyethylene Glycol - and “artificial” fiber commonly called Miralax or Glycolax.  It is helpful  for people with gassy or bloated feelings with regular fiber °o Flax Seed - a less gassy fiber than psyllium °  No reading or other relaxing activity while on the toilet. If bowel movements take longer than 5 minutes, you are too constipated °  AVOID CONSTIPATION.  High fiber and water intake usually takes care of this.  Sometimes a laxative is needed to stimulate more frequent bowel movements, but  °  Laxatives are not a good long-term solution as it can wear the colon out. °o Osmotics (Milk of Magnesia, Fleets phosphosoda, Magnesium citrate, MiraLax, GoLytely) are safer than  °o Stimulants (Senokot, Castor Oil, Dulcolax, Ex Lax)    °o Do not take laxatives for more than 7days in a row. °   IF SEVERELY CONSTIPATED, try a Bowel Retraining Program: °o Do not use laxatives.  °o Eat a diet high in roughage, such as bran cereals and leafy vegetables.  °o Drink six (6) ounces of prune or apricot juice each morning.  °o Eat two (2) large servings of stewed fruit each day.  °o Take one (1) heaping tablespoon of a psyllium-based bulking agent twice a day. Use sugar-free sweetener when possible to avoid excessive calories.  °o Eat a normal breakfast.  °o Set aside 15 minutes after breakfast to sit on the toilet, but do not strain to have a bowel movement.  °o If you do not have a bowel movement by the third day, use an enema and repeat the above steps.  °  Controlling diarrhea °o Switch to liquids and simpler foods for a few days to avoid stressing your intestines further. °o Avoid dairy products (especially milk & ice cream) for a short time.  The intestines often can lose the ability to digest lactose when stressed. °o Avoid foods that cause gassiness or bloating.  Typical foods include beans and other legumes, cabbage, broccoli, and dairy foods.  Every person has some sensitivity to other foods, so listen to our body and avoid those foods that trigger problems for you. °o Adding fiber (Citrucel, Metamucil, psyllium,  Miralax) gradually can help thicken stools by absorbing excess fluid and retrain the intestines to act more normally.  Slowly increase the dose over a few weeks.  Too much fiber too soon can backfire and cause cramping & bloating. °o Probiotics (such as active yogurt, Align, etc) may help repopulate the intestines and colon with normal bacteria and calm down a sensitive digestive tract.  Most studies show it to be of mild help, though, and such products can be costly. °o Medicines: °  Bismuth   up to 6 doses can help control diarrhea.  Avoid if pregnant. °- Loperamide (Immodium) can slow down diarrhea.  Start with two tablets (4mg total) first and then try one tablet every 6 hours.  Avoid if you are having fevers or severe pain.  If you are not better or start feeling worse, stop all medicines and call your doctor for advice °o Call your doctor if you are getting worse or not better.  Sometimes further testing (cultures, endoscopy, X-ray studies, bloodwork, etc) may be needed to help diagnose and treat the cause of the diarrhea. ° °Managing Pain ° °Pain after surgery or related to activity is often due to strain/injury to muscle, tendon, nerves and/or incisions.  This pain is usually short-term and will improve in a few months.  ° °Many people find it helpful to do the following things TOGETHER to help speed the process of healing and to get back to regular activity more quickly: ° °1. Avoid heavy physical activity °a.  no lifting greater than 20 pounds °b. Do not “push through” the pain.  Listen to your body and avoid positions and maneuvers than reproduce the pain °c. Walking is okay as tolerated, but go slowly and stop when getting sore.  °d. Remember: If it hurts to do it, then don’t do it! °2. Take Anti-inflammatory medication  °a. Take with food/snack around the clock for 1-2 weeks °i. This helps the muscle and nerve tissues become less irritable  and calm down faster °b. Choose ONE of the following over-the-counter medications: °i. Naproxen 220mg tabs (ex. Aleve) 1-2 pills twice a day  °ii. Ibuprofen 200mg tabs (ex. Advil, Motrin) 3-4 pills with every meal and just before bedtime °iii. Acetaminophen 500mg tabs (Tylenol) 1-2 pills with every meal and just before bedtime °3. Use a Heating pad or Ice/Cold Pack °a. 4-6 times a day °b. May use warm bath/hottub  or showers °4. Try Gentle Massage and/or Stretching  °a. at the area of pain many times a day °b. stop if you feel pain - do not overdo it ° °Try these steps together to help you body heal faster and avoid making things get worse.  Doing just one of these things may not be enough.   ° °If you are not getting better after two weeks or are noticing you are getting worse, contact our office for further advice; we may need to re-evaluate you & see what other things we can do to help. ° ° °

## 2013-08-14 LAB — BASIC METABOLIC PANEL
BUN: 8 mg/dL (ref 6–23)
CALCIUM: 8.4 mg/dL (ref 8.4–10.5)
CO2: 25 mEq/L (ref 19–32)
Chloride: 106 mEq/L (ref 96–112)
Creatinine, Ser: 0.82 mg/dL (ref 0.50–1.10)
GFR calc Af Amer: 87 mL/min — ABNORMAL LOW (ref >90)
GFR, EST NON AFRICAN AMERICAN: 75 mL/min — AB (ref >90)
GLUCOSE: 109 mg/dL — AB (ref 70–99)
POTASSIUM: 4.8 meq/L (ref 3.7–5.3)
SODIUM: 140 meq/L (ref 137–147)

## 2013-08-14 LAB — CBC
HCT: 31.1 % — ABNORMAL LOW (ref 36.0–46.0)
Hemoglobin: 9.8 g/dL — ABNORMAL LOW (ref 12.0–15.0)
MCH: 27.9 pg (ref 26.0–34.0)
MCHC: 31.5 g/dL (ref 30.0–36.0)
MCV: 88.6 fL (ref 78.0–100.0)
PLATELETS: 203 10*3/uL (ref 150–400)
RBC: 3.51 MIL/uL — ABNORMAL LOW (ref 3.87–5.11)
RDW: 14.2 % (ref 11.5–15.5)
WBC: 12.1 10*3/uL — AB (ref 4.0–10.5)

## 2013-08-14 LAB — MAGNESIUM: MAGNESIUM: 1.7 mg/dL (ref 1.5–2.5)

## 2013-08-14 NOTE — Care Management Note (Addendum)
    Page 1 of 1   08/15/2013     10:35:20 AM   CARE MANAGEMENT NOTE 08/15/2013  Patient:  Christine Shepherd, Christine Shepherd   Account Number:  [id number]  Date Initiated:  08/14/2013  Documentation initiated by:  Sunday Spillers  Subjective/Objective Assessment:   63 yo female admitted s/p colectomy. PTA lived at home alone.     Action/Plan:   Home when stable   Anticipated DC Date:  08/15/2013   Anticipated DC Plan:  [place]  CM consult      Choice offered to / List presented to:             Status of service:  Completed, signed off Medicare Important Message given?  NA - LOS <3 / Initial given by admissions (If response is "NO", the following Medicare IM given date fields will be blank) Date Medicare IM given:   Date Additional Medicare IM given:    Discharge Disposition:  HOME/SELF CARE  Per UR Regulation:  Reviewed for med. necessity/level of care/duration of stay  If discussed at [place] of Stay Meetings, dates discussed:    Comments:

## 2013-08-14 NOTE — Progress Notes (Signed)
1 Day Post-Op  Subjective: Doing okay.  Pain well controlled.  No N/V.  Not much gas yet.  Ambulating some in the halls.  No BM yet.    Objective: Vital signs in last 24 hours: Temp:  [97.6 F (36.4 C)-98.6 F (37 C)] 98.6 F (37 C) (02/04 1400) Pulse Rate:  [70-84] 82 (02/04 1400) Resp:  [18] 18 (02/04 1400) BP: (109-149)/(60-69) 144/60 mmHg (02/04 1400) SpO2:  [98 %-100 %] 100 % (02/04 1400) Weight:  [171 lb 8.3 oz (77.8 kg)] 171 lb 8.3 oz (77.8 kg) (02/04 0030)    Intake/Output from previous day: 02/03 0701 - 02/04 0700 In: 6597.5 [P.O.:840; I.V.:5207.5; IV Piggyback:550] Out: 5784 [Urine:3125; Blood:50] Intake/Output this shift: Total I/O In: 880 [P.O.:480; I.V.:400] Out: 1450 [Urine:1450]  PE: Gen:  Alert, NAD, pleasant Abd: Soft, mild tenderness at incision sites, ND, +BS, no HSM, incisions C/D/I   Lab Results:   Recent Labs  08/14/13 0514  WBC 12.1*  HGB 9.8*  HCT 31.1*  PLT 203   BMET  Recent Labs  08/14/13 0514  NA 140  K 4.8  CL 106  CO2 25  GLUCOSE 109*  BUN 8  CREATININE 0.82  CALCIUM 8.4   PT/INR No results found for this basename: LABPROT, INR,  in the last 72 hours CMP     Component Value Date/Time   NA 140 08/14/2013 0514   K 4.8 08/14/2013 0514   CL 106 08/14/2013 0514   CO2 25 08/14/2013 0514   GLUCOSE 109* 08/14/2013 0514   BUN 8 08/14/2013 0514   CREATININE 0.82 08/14/2013 0514   CALCIUM 8.4 08/14/2013 0514   PROT 7.3 07/02/2013 1518   ALBUMIN 4.2 07/02/2013 1518   AST 16 07/02/2013 1518   ALT 14 07/02/2013 1518   ALKPHOS 42 07/02/2013 1518   BILITOT 0.7 07/02/2013 1518   GFRNONAA 75* 08/14/2013 0514   GFRAA 87* 08/14/2013 0514   Lipase     Component Value Date/Time   LIPASE 29 11/13/2011 0913       Studies/Results: No results found.  Anti-infectives: Anti-infectives   Start     Dose/Rate Route Frequency Ordered Stop   08/13/13 2000  cefoTEtan (CEFOTAN) 2 g in dextrose 5 % 50 mL IVPB     2 g 100 mL/hr over 30 Minutes  Intravenous Every 12 hours 08/13/13 1424 08/13/13 2059   08/13/13 1500  valACYclovir (VALTREX) tablet 1,000 mg     1,000 mg Oral Daily PRN 08/13/13 1424     08/13/13 1000  clindamycin (CLEOCIN) 900 mg, gentamicin (GARAMYCIN) 240 mg in sodium chloride 0.9 % 1,000 mL for intraperitoneal lavage  Status:  Discontinued      Intraperitoneal To Surgery 08/13/13 0848 08/13/13 1408   08/13/13 0537  cefoTEtan (CEFOTAN) 2 g in dextrose 5 % 50 mL IVPB     2 g 100 mL/hr over 30 Minutes Intravenous On call to O.R. 08/13/13 0537 08/13/13 0801       Assessment/Plan POD #1 s/p robot assisted laparoscopic sigmoid colectomy, rigid proctoscopy, lap lysis of adhesions, primary repair of ventral hernia and umbilical hernia  Plan: 1.  On clear liquid diet, wait to advance when Dr. Johney Maine sees his patient 2.  Ambulate and IS 3.  SCD's and heparin 4.  D/c home when Dr. Johney Maine sees medically ready 5.  Tech removed foley today instead of tomorrow, but patient is urinating on own without difficulty    LOS: 1 day    [place], Emali Heyward 08/14/2013, 4:06  PM Pager: [phone]

## 2013-08-14 NOTE — Progress Notes (Signed)
Patient vital signs stable.  Incision within normal limits. Patient is ambulating and tolerating diet. Foley was discontinued this morning and patient is voiding accurately.  No complaints of nausea or vomiting.  Pain medication effective. Basilio Cairo, Student RN

## 2013-08-14 NOTE — Progress Notes (Signed)
Advance diet per protocol.  Mobilize.  Followup on pathology

## 2013-08-15 MED ORDER — OXYCODONE HCL 5 MG PO TABS: 5.0000 mg | ORAL_TABLET | ORAL | Status: DC | PRN

## 2013-08-15 NOTE — Progress Notes (Signed)
Making good rapid recovery so far.  Followup on pathology.  Hopefully home later today if continues to work towards goals.

## 2013-08-15 NOTE — Progress Notes (Signed)
Discharge instructions reviewed with patient, vital signs are stable, incisions are within normal limits, patient tolerating diet without complaints of nausea or vomiting, passing flatus, up and ambulating in hallway, prescription given and patient taught on dry dressing change, patient to follow up with MD Johney Maine Neta Mends RN 08-15-2012 14:09pm

## 2013-08-15 NOTE — Progress Notes (Signed)
Patient ID: Christine Shepherd, female   DOB: 03-21-1951, 63 y.o.   MRN: [id number]  2 Days Post-Op  Subjective: Pt feels good today.  Not complaining of much pain except tenderness at incision sites.  C/o of shoulder pain this morning which has resolved after taking pain meds.  Good appetite and tolerating food well.  No c/o N/V.  No BMs but passing flatus.  Urinating well. Ambulating well.  Objective: Vital signs in last 24 hours: Temp:  [98.3 F (36.8 C)-99.7 F (37.6 C)] 99.7 F (37.6 C) (02/05 0540) Pulse Rate:  [70-87] 87 (02/05 0540) Resp:  [18] 18 (02/05 0540) BP: (128-160)/(60-83) 160/83 mmHg (02/05 0540) SpO2:  [97 %-100 %] 97 % (02/05 0540) Weight:  [168 lb 10.4 oz (76.5 kg)] 168 lb 10.4 oz (76.5 kg) (02/05 0553)    Intake/Output from previous day: 02/04 0701 - 02/05 0700 In: 1600 [P.O.:1200; I.V.:400] Out: 3650 [Urine:3650] Intake/Output this shift:    Physical Exam:  General: pleasant, WD/WN AA female who is laying in bed in NAD HEENT: head is normocephalic, atraumatic.  Sclera are noninjected.  PERRL.  Ears and nose without any masses or lesions.  Mouth is pink and moist Heart: regular, rate, and rhythm.  Normal s1,s2. No obvious murmurs, gallops, or rubs noted.  Palpable radial and pedal pulses bilaterally Lungs: CTAB, no wheezes, rhonchi, or rales noted.  Respiratory effort nonlabored. IS 1500 Abd: soft, ND, mild tender at incision sites, tegaderm on incisions, On q pain pump in place, +BS, no masses, hernias, or organomegaly MS: all 4 extremities are symmetrical with no cyanosis, clubbing, or edema. Skin: warm and dry with no masses, lesions, or rashes Psych: A&Ox3 with an appropriate affect.  Lab Results:   Recent Labs  08/14/13 0514  WBC 12.1*  HGB 9.8*  HCT 31.1*  PLT 203   BMET  Recent Labs  08/14/13 0514  NA 140  K 4.8  CL 106  CO2 25  GLUCOSE 109*  BUN 8  CREATININE 0.82  CALCIUM 8.4   PT/INR No results found for this basename: LABPROT,  INR,  in the last 72 hours CMP     Component Value Date/Time   NA 140 08/14/2013 0514   K 4.8 08/14/2013 0514   CL 106 08/14/2013 0514   CO2 25 08/14/2013 0514   GLUCOSE 109* 08/14/2013 0514   BUN 8 08/14/2013 0514   CREATININE 0.82 08/14/2013 0514   CALCIUM 8.4 08/14/2013 0514   PROT 7.3 07/02/2013 1518   ALBUMIN 4.2 07/02/2013 1518   AST 16 07/02/2013 1518   ALT 14 07/02/2013 1518   ALKPHOS 42 07/02/2013 1518   BILITOT 0.7 07/02/2013 1518   GFRNONAA 75* 08/14/2013 0514   GFRAA 87* 08/14/2013 0514   Lipase     Component Value Date/Time   LIPASE 29 11/13/2011 0913       Studies/Results: No results found.  Anti-infectives: Anti-infectives   Start     Dose/Rate Route Frequency Ordered Stop   08/13/13 2000  cefoTEtan (CEFOTAN) 2 g in dextrose 5 % 50 mL IVPB     2 g 100 mL/hr over 30 Minutes Intravenous Every 12 hours 08/13/13 1424 08/13/13 2059   08/13/13 1500  valACYclovir (VALTREX) tablet 1,000 mg     1,000 mg Oral Daily PRN 08/13/13 1424     08/13/13 1000  clindamycin (CLEOCIN) 900 mg, gentamicin (GARAMYCIN) 240 mg in sodium chloride 0.9 % 1,000 mL for intraperitoneal lavage  Status:  Discontinued  Intraperitoneal To Surgery 08/13/13 0848 08/13/13 1408   08/13/13 0537  cefoTEtan (CEFOTAN) 2 g in dextrose 5 % 50 mL IVPB     2 g 100 mL/hr over 30 Minutes Intravenous On call to O.R. 08/13/13 0537 08/13/13 0801       Assessment/Plan POD #2 s/p robot assisted laparoscopic sigmoid colectomy, rigid proctoscopy, lap lysis of adhesions, primary repair of ventral hernia and umbilical hernia  Plan:  1. Continue to ambulate and use IS 2. SCD's and on heparin for DVT prophylaxis 3. Advance diet to regular foods Criss Rosales) for lunch. 4. Discharge home when medically stable, hopefully today 5. D/c on q pain pump before discharge home, change lower abdominal dressing to dry dressing     LOS: 2 days    Janifer Adie PA-S [facility]   08/15/2013, 8:43 AM [facility]  Surgery [phone]

## 2013-08-16 ENCOUNTER — Other Ambulatory Visit (INDEPENDENT_AMBULATORY_CARE_PROVIDER_SITE_OTHER): Payer: Self-pay

## 2013-08-16 ENCOUNTER — Telehealth (INDEPENDENT_AMBULATORY_CARE_PROVIDER_SITE_OTHER): Payer: Self-pay

## 2013-08-16 DIAGNOSIS — K429 Umbilical hernia without obstruction or gangrene: Secondary | ICD-10-CM

## 2013-08-16 DIAGNOSIS — K432 Incisional hernia without obstruction or gangrene: Secondary | ICD-10-CM

## 2013-08-16 DIAGNOSIS — C187 Malignant neoplasm of sigmoid colon: Secondary | ICD-10-CM

## 2013-08-16 MED ORDER — PROMETHAZINE HCL 12.5 MG PO TABS: 12.5000 mg | ORAL_TABLET | Freq: Four times a day (QID) | ORAL | Status: DC | PRN

## 2013-08-16 MED ORDER — ONDANSETRON HCL 4 MG PO TABS: 4.0000 mg | ORAL_TABLET | Freq: Three times a day (TID) | ORAL | Status: DC | PRN

## 2013-08-16 NOTE — Telephone Encounter (Signed)
Rx could not be e-prescribed so was phoned in to pharmacy.

## 2013-08-16 NOTE — Discharge Summary (Signed)
Physician Discharge Summary  Patient ID: Christine Shepherd MRN: [id number] DOB/AGE: [birth year] 63 y.o.  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  Biopsy concerned for residual cancer.  Recommendation made for resection.  Underwent robotic colectomy at time of admission.  Postoperatively, the patient mobilized in the hallways and advanced to a solid diet gradually.  Pain was well-controlled and transitioned off IV medications.    By the time of discharge, the patient was walking well the hallways, eating food well, having flatus.  Pain was-controlled on an oral regimen.  Based on meeting DC criteria and recovering well, I felt it was safe for the patient to be discharged home with close followup.  Instructions were discussed in detail.  They are written as well.     Consults: None  Significant Diagnostic Studies:   Results for orders placed during the hospital encounter of 08/13/13 (from the past 72 hour(s))  BASIC METABOLIC PANEL     Status: Abnormal   Collection Time    08/14/13  5:14 AM      Result Value Range   Sodium 140  137 - 147 mEq/L   Potassium 4.8  3.7 - 5.3 mEq/L   Chloride 106  96 - 112 mEq/L   CO2 25  19 - 32 mEq/L   Glucose, Bld 109 (*) 70 - 99 mg/dL   BUN 8  6 - 23 mg/dL   Creatinine, Ser 0.82  0.50 - 1.10 mg/dL   Calcium 8.4  8.4 - 10.5 mg/dL   GFR calc non Af Amer 75 (*) >90 mL/min   GFR calc Af Amer 87 (*) >90 mL/min   Comment: (NOTE)     The eGFR has been calculated using the CKD EPI equation.     This calculation has not been validated in all clinical situations.     eGFR's  persistently <90 mL/min signify possible Chronic Kidney     Disease.  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 rate 18, height _0  (1.626 m), weight 168 lb 10.4 oz (76.5 kg), SpO2 97.00%.  General: Pt awake/alert/oriented x4 in no major acute distress.  Smiling, sitting at bedside.  Walking in room comfortably Eyes: PERRL, normal EOM. Sclera nonicteric Neuro: CN II-XII intact w/o focal sensory/motor deficits. Lymph: No head/neck/groin lymphadenopathy Psych:  No delerium/psychosis/paranoia HENT: Normocephalic, Mucus membranes moist.  No thrush Neck: Supple, No tracheal deviation Chest: No pain.  Good respiratory excursion. CV:  Pulses intact.  Regular rhythm MS: Normal AROM mjr joints.  No obvious deformity Abdomen: Soft, Nondistended.  Min TTP at incisions.  No incarcerated hernias. Ext:  SCDs BLE.  No significant edema.  No cyanosis Skin: No petechiae / purpura   Disposition: 01-Home or Self Care  Discharge  Orders   Future Appointments Provider Department Dept Phone   08/23/2013 8:30 AM Adin Hector, MD [facility] Surgery, [place] [phone]   Future Orders Complete By Expires   Call MD for:  extreme fatigue  As directed    Call MD for:  hives  As directed    Call MD for:  persistant nausea and vomiting  As directed    Call MD for:  redness, tenderness, or signs of infection (pain, swelling, redness, odor or green/yellow discharge around incision site)  As directed    Call MD for:  severe uncontrolled pain  As directed    Call MD for:  As directed    Comments:     Temperature > 101.6F   Diet - low sodium heart healthy  As directed    Discharge instructions  As directed    Comments:     Please see discharge instruction sheets.  Also refer to handout given an office.  Please call our office if you have any questions or concerns [phone]   Discharge wound care:  As directed    Comments:     If you have closed incisions, shower and bathe over these incisions with soap and water every day.  Remove all surgical dressings on postoperative day #3.  You do not need to replace dressings over the closed incisions unless you feel more comfortable with a Band-Aid covering it.   If you have an open wound that requires packing, please see wound care instructions.  In general, remove all dressings, wash wound with soap and water and then replace with saline moistened gauze.  Do the dressing change at least every day.  Please call our office [phone] if you have further questions.   Driving Restrictions  As directed    Comments:     No driving until off narcotics and can safely swerve away without pain during an emergency   Increase activity slowly  As directed    Comments:     Walk an hour a day.  Use 20-30 minute walks.  When you can walk 30 minutes without difficulty, increase to low impact/moderate activities such as biking, jogging, swimming, sexual activity..  Eventually can increase to  unrestricted activity when not feeling pain.  If you feel pain: STOP![name]   Let pain protect you from overdoing it.  Use ice/heat/over-the-counter pain medications to help minimize his soreness.  Use pain prescriptions as needed to remain active.  It is better to take extra pain medications and be more active than to stay bedridden to avoid all pain medications.  Lifting restrictions  As directed    Comments:     Avoid heavy lifting initially.  Do not push through pain.  You have no specific weight limit.  Coughing and sneezing or four more stressful to your incision than any lifting you will do. Pain will protect you from injury.  Therefore, avoid intense activity until off all narcotic pain medications.  Coughing and sneezing or four more stressful to your incision than any lifting he will do.   May shower / Bathe  As directed    May walk up steps  As directed    Sexual Activity Restrictions  As directed    Comments:     Sexual activity as tolerated.  Do not push through pain.  Pain will protect you from injury.   Walk with assistance  As directed    Comments:     Walk over an hour a day.  May use a walker/cane/companion to help with balance and stamina.       Medication List         estradiol 0.05 MG/24HR patch  Commonly known as:  VIVELLE-DOT  Place 1 patch onto the skin 2 (two) times a week. Tuesday and fridays     ibuprofen 200 MG tablet  Commonly known as:  ADVIL,MOTRIN  Take 200-400 mg by mouth every 6 (six) hours as needed for headache or moderate pain. Pain     levothyroxine 75 MCG tablet  Commonly known as:  SYNTHROID, LEVOTHROID  Take 75 mcg by mouth daily with breakfast.     multivitamin with minerals Tabs tablet  Take 1 tablet by mouth daily.     oxyCODONE 5 MG immediate release tablet  Commonly known as:  Oxy IR/ROXICODONE  Take 1-2 tablets (5-10 mg total) by mouth every 4 (four) hours as needed for moderate pain, severe pain or breakthrough pain.     valACYclovir  1000 MG tablet  Commonly known as:  VALTREX  Take 1,000 mg by mouth daily as needed (outbreaks).     valsartan-hydrochlorothiazide 160-12.5 MG per tablet  Commonly known as:  DIOVAN-HCT  Take 1 tablet by mouth every morning.           Follow-up Information   Follow up with Jenavie Stanczak C., MD. Schedule an appointment as soon as possible for a visit in 2 weeks.   Specialty:  General Surgery   Contact information:   [address] [place] [address] [phone]       Follow up with GREEN, Keenan Bachelor, MD.   Specialty:  Internal Medicine   Contact information:   [place], [place] [address] [phone]       Signed: Rielyn Krupinski C. 08/16/2013, 11:08 AM

## 2013-08-16 NOTE — Telephone Encounter (Signed)
Pt is s/p lap sigmoid colectomy on 08/13/13.  She is calling today c/o nausea.  Dr. Johney Maine was paged and prescribed Zofran 4 mg., Phenergan 12.5 mg, and clear liquids for next two days.  Rx's sent to [facility].  Pt advised to call if no relief after taking these meds.

## 2013-08-20 ENCOUNTER — Telehealth (INDEPENDENT_AMBULATORY_CARE_PROVIDER_SITE_OTHER): Payer: Self-pay

## 2013-08-20 DIAGNOSIS — E042 Nontoxic multinodular goiter: Secondary | ICD-10-CM

## 2013-08-20 NOTE — Telephone Encounter (Signed)
Added pt to GI cancer conference for 08/28/13 for full discussion per Dr Johney Maine.

## 2013-08-20 NOTE — Telephone Encounter (Signed)
Called pt to check on her after her colon surgery. The pt is doing good with eating food slowly, moving her bowels, and walking daily. The pt has a f/u appt with Dr Johney Maine on 08/23/13. I notified pt that Dr Johney Maine wants pt to go see a endocrinologist to evaluate her thyroid nodules. I placed an order in epic and our scheduler will call her with the appt info. The pt understands.

## 2013-08-23 ENCOUNTER — Encounter (INDEPENDENT_AMBULATORY_CARE_PROVIDER_SITE_OTHER): Payer: Self-pay | Admitting: Surgery

## 2013-08-23 ENCOUNTER — Ambulatory Visit (INDEPENDENT_AMBULATORY_CARE_PROVIDER_SITE_OTHER): Payer: BC Managed Care – PPO | Admitting: Surgery

## 2013-08-23 VITALS — BP 158/78 | HR 98 | Resp 16 | Ht 66.0 in | Wt 164.6 lb

## 2013-08-23 DIAGNOSIS — K432 Incisional hernia without obstruction or gangrene: Secondary | ICD-10-CM

## 2013-08-23 DIAGNOSIS — K429 Umbilical hernia without obstruction or gangrene: Secondary | ICD-10-CM

## 2013-08-23 DIAGNOSIS — E042 Nontoxic multinodular goiter: Secondary | ICD-10-CM

## 2013-08-23 DIAGNOSIS — C187 Malignant neoplasm of sigmoid colon: Secondary | ICD-10-CM

## 2013-08-23 NOTE — Progress Notes (Signed)
Subjective:     Patient ID: Christine Shepherd, female   DOB: 12-15-1950, 63 y.o.   MRN: [id number]  HPI  Note: This dictation was prepared with Dragon/digital dictation along with [employer]. Any transcriptional errors that result from this process are unintentional.       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  Chest some mild soreness but is walking well and going up and down stairs.  Using Tylenol only.  Economics her mouth dry.  No fevers or chills.  Eating solid diet.  Taking Metamucil fiber supplement.  Energy level gradually returning.  Does not to go back to work at as a child has a fair amount of strenuous activity.  No rectal bleeding.   Patient Active Problem List   Diagnosis Date Noted  . Umbilical hernia s/p primary repair 08/13/2013 08/16/2013  . Ventral incisional hernia s/p primary repair 08/13/2013 08/16/2013  . Cancer of sigmoid colon [id number] s/p robotic colectomy 08/13/2013 07/17/2013  . Hypertension   . Multiple thyroid nodules     Past Medical History  Diagnosis Date  . Hypertension   . Thyroid disease   . Gallstones   . Acute cholecystitis with chronic cholecystitis s/p lap  chole [id number] 11/15/2011  . Complication of anesthesia 06/27/2013    nausea  . Hypothyroidism     Past Surgical History  Procedure Laterality Date  . Dilation and curettage of uterus    . Tubal ligation    . Flexible sigmoidoscopy N/A 07/22/2013    Procedure: FLEXIBLE SIGMOIDOSCOPY;  Surgeon: Lafayette Dragon, MD;  Location: [facility];  Service: Endoscopy;  Laterality: N/A;  ja/    tatoo  . Abdominal hysterectomy      hx. fibroids  . Cholecystectomy  11/17/11    laparoscopic    History   Social History  . Marital Status: Divorced    Spouse Name: N/A    Number of Children: N/A  . Years of Education: N/A   Occupational History  . Not on file.   Social History Main Topics  . Smoking status: Never Smoker   . Smokeless tobacco: Never Used  . Alcohol Use: No  . Drug Use: No  . Sexual Activity: Yes   Other Topics Concern  . Not on file   Social History Narrative  . No narrative on file    Family History  Problem Relation Age of Onset  . Cancer Father     Prostate Ca  . Cancer Brother     unknown  . Colon cancer Neg Hx     Current Outpatient Prescriptions  Medication Sig Dispense Refill  . estradiol (VIVELLE-DOT) 0.05 MG/24HR Place 1 patch onto the skin 2 (two) times a week. Tuesday and fridays      . ibuprofen (ADVIL,MOTRIN)  200 MG tablet Take 200-400 mg by mouth every 6 (six) hours as needed for headache or moderate pain. Pain      . levothyroxine (SYNTHROID, LEVOTHROID) 75 MCG tablet Take 75 mcg by mouth daily with breakfast.       . Multiple Vitamin (MULITIVITAMIN WITH MINERALS) TABS Take 1 tablet by mouth daily.      . ondansetron (ZOFRAN) 4 MG tablet Take 1 tablet (4 mg total) by mouth every 8 (eight) hours as needed for nausea or vomiting.  20 tablet  0  . promethazine (PHENERGAN) 12.5 MG tablet Take 1 tablet (12.5 mg total) by mouth every 6 (six) hours as needed for nausea or vomiting.  30 tablet  0  . valACYclovir (VALTREX) 1000 MG tablet Take 1,000 mg by  mouth daily as needed (outbreaks).       . valsartan-hydrochlorothiazide (DIOVAN-HCT) 160-12.5 MG per tablet Take 1 tablet by mouth every morning.        No current facility-administered medications for this visit.     No Known Allergies  BP 158/78  Pulse 98  Resp 16  Ht 5\' 6"  (1.676 m)  Wt 164 lb 9.6 oz (74.662 kg)  BMI 26.58 kg/m2  Ct Chest W Contrast  07/24/2013   CLINICAL DATA:  New diagnosis of sigmoid colon carcinoma. Evaluate for metastasis. History of thyroid disease.  EXAM: CT CHEST WITH CONTRAST  TECHNIQUE: Multidetector CT imaging of the chest was performed during intravenous contrast administration.  CONTRAST:  79mL OMNIPAQUE IOHEXOL 300 MG/ML  SOLN  COMPARISON:  DG CHEST 2V dated 07/19/2013; CT ABD/PELVIS W CM dated 07/03/2013  FINDINGS: Lungs/Pleura:  No nodules or airspace opacities. No pleural fluid.  Heart/Mediastinum:  No supraclavicular adenopathy.  Relatively diffuse enlargement of the thyroid. The right lobe extends inferiorly into the upper chest. This nodule or extension measures 2.6 cm on image 8.  Aortic and branch vessel atherosclerosis. Normal heart size, without pericardial effusion. LAD coronary artery atherosclerosis on image 31. No mediastinal or hilar adenopathy.  Upper Abdomen:  Cholecystectomy.  Bones/Musculoskeletal:  No acute osseous abnormality.  IMPRESSION: 1.  No acute process or evidence of metastatic disease in the chest. 2. Age advanced coronary artery atherosclerosis. Recommend assessment of coronary risk factors and consideration of medical therapy. 3. Thyromegaly with a nodule or area of extension into the upper chest. Consider further evaluation with thyroid ultrasound.   Electronically Signed   By: Abigail Miyamoto M.D.   On: 07/24/2013 14:55   US Soft Tissue Head/neck  08/07/2013   CLINICAL DATA:  Multinodular goiter. Right substernal mass noted on recent CT chest.  EXAM: THYROID ULTRASOUND  TECHNIQUE: Ultrasound examination of the thyroid gland and  adjacent soft tissues was performed.  COMPARISON:  CT 07/24/2013  FINDINGS: Right thyroid lobe  Measurements: 67 x 48 x 34 mm. Inhomogeneous background echotexture with multiple nodules.  44 x 26 x 26 mm solid, deep inferior pole, corresponding to lesion seen on CT, not discretely identified on previous exam  31 x 13 x 26 mm solid, superior pole (previously 13 x 14 x 19)  34 x 24 x 28 mm solid, mid lobe (previously 17 x 22 x 24)  Left thyroid lobe  Measurements: 61 x 23 x 28 mm. Inhomogeneous parenchyma with multiple nodules, largest  38 x 21 x 32 mm solid, mid lobe (previously 17 x 21)  13 x 13 x 14 mm solid, inferior pole  8 x 6 x 11 mm complex mostly solid, medial near isthmus  Isthmus  Thickness: 9 mm. There is a 15 x 10 x 14 mm complex mostly solid nodule just to the right of midline (previously 8 x 8 x 11)  Lymphadenopathy  None visualized.  IMPRESSION: 1. Thyromegaly with multiple nodules, all increased in size since previous exam. Findings meet consensus criteria for biopsy. Ultrasound-guided fine needle aspiration should be considered, and is scheduled, as per the consensus statement: Management of Thyroid Nodules Detected at [place]: Society of Radiologists in [place]. Radiology 2005; [id number].   Electronically Signed   By: Arne Cleveland M.D.   On: 08/07/2013 15:34   US Thyroid Biopsy  08/07/2013   CLINICAL DATA:  Thyromegaly with multiple enlarging thyroid nodules. Right substernal extension noted on recent CT.  EXAM: ULTRASOUND-GUIDED THYROID ASPIRATION BIOPSY x2  TECHNIQUE: The procedure, risks (including but not limited to bleeding, infection, organ damage ), benefits, and alternatives were explained to the patient. Questions regarding the procedure were encouraged and answered. The patient understands and consents to the procedure.  Survey ultrasound was performed and the dominant lesion in the deep inferior pole RIGHT lobe was localized. An appropriate skin entry  site was determined. Skin was marked, then prepped with Betadine, draped in usual sterile fashion, and infiltrated locally with 1% lidocaine. Under real-time ultrasound guidance, 3 passes were made into the lesion with 21 gauge Inrad needles.  In similar fashion, the dominant lesion in the mid LEFT lobe was localized. An appropriate skin entry site was determined. Skin was marked, then prepped with Betadine, draped in usual sterile fashion, and infiltrated locally with 1% lidocaine. Under real-time ultrasound guidance, 3 passes were made into the lesion with 25 gauge needles.  The patient tolerated procedures well, with no immediate complications.  IMPRESSION: 1. Technically successful ultrasound-guided thyroid aspiration biopsy , dominant RIGHT deep inferior pole lesion. 2. Technically successful ultrasound-guided thyroid FNA biopsy of dominant mid LEFT lesion.   Electronically Signed   By: Arne Cleveland M.D.   On: 08/07/2013 15:37   US Thyroid Biopsy  08/07/2013   CLINICAL DATA:  Thyromegaly with multiple enlarging thyroid nodules. Right substernal extension noted on recent CT.  EXAM: ULTRASOUND-GUIDED THYROID ASPIRATION BIOPSY x2  TECHNIQUE: The procedure, risks (including but not limited to bleeding, infection, organ damage ), benefits, and alternatives were explained to the patient. Questions regarding the procedure were encouraged and answered. The patient understands and consents to the procedure.  Survey ultrasound was performed and the dominant lesion in the deep inferior pole RIGHT lobe was localized. An appropriate skin entry site was determined. Skin was marked, then prepped with Betadine, draped in usual sterile fashion, and infiltrated locally with 1% lidocaine. Under real-time ultrasound guidance, 3 passes were made into the lesion with 21 gauge Inrad needles.  In similar fashion, the dominant lesion in the mid LEFT lobe was localized. An appropriate skin entry site was determined. Skin was  marked, then prepped with Betadine, draped in usual sterile fashion, and infiltrated locally with 1% lidocaine. Under real-time ultrasound guidance, 3 passes were made into the lesion with 25 gauge needles.  The patient tolerated procedures well, with no immediate complications.  IMPRESSION: 1. Technically successful ultrasound-guided thyroid aspiration biopsy , dominant RIGHT deep inferior pole lesion. 2. Technically successful ultrasound-guided thyroid FNA biopsy of dominant mid LEFT lesion.   Electronically Signed   By: Arne Cleveland M.D.   On: 08/07/2013 15:37    Review of Systems  Constitutional: Negative for fever, chills and diaphoresis.  HENT: Negative for  drooling, ear pain, mouth sores, sore throat and trouble swallowing.   Eyes: Negative for photophobia and visual disturbance.  Respiratory: Negative for cough and choking.   Cardiovascular: Negative for chest pain and palpitations.  Gastrointestinal: Negative for nausea, vomiting, abdominal pain, diarrhea, constipation, anal bleeding and rectal pain.  Genitourinary: Negative for dysuria, frequency and difficulty urinating.  Musculoskeletal: Negative for gait problem and myalgias.  Skin: Negative for color change, pallor and rash.  Neurological: Negative for dizziness, speech difficulty, weakness and numbness.  Hematological: Negative for adenopathy.  Psychiatric/Behavioral: Negative for confusion and agitation. The patient is not nervous/anxious.        Objective:   Physical Exam  Constitutional: She is oriented to person, place, and time. She appears well-developed and well-nourished. No distress.  HENT:  Head: Normocephalic.  Mouth/Throat: Oropharynx is clear and moist. No oropharyngeal exudate.  Eyes: Conjunctivae and EOM are normal. Pupils are equal, round, and reactive to light. No scleral icterus.  Neck: Normal range of motion. No tracheal deviation present.  Cardiovascular: Normal rate and intact distal pulses.    Pulmonary/Chest: Effort normal. No respiratory distress. She exhibits no tenderness.  Abdominal: Soft. She exhibits no distension. There is no tenderness. Hernia confirmed negative in the right inguinal area and confirmed negative in the left inguinal area.  Incisions clean with normal healing ridges.  No hernias  Genitourinary: No vaginal discharge found.  Musculoskeletal: Normal range of motion. She exhibits no tenderness.  Lymphadenopathy:       Right: No inguinal adenopathy present.       Left: No inguinal adenopathy present.  Neurological: She is alert and oriented to person, place, and time. No cranial nerve deficit. She exhibits normal muscle tone. Coordination normal.  Skin: Skin is warm and dry. No rash noted. She is not diaphoretic.  Psychiatric: She has a normal mood and affect. Her behavior is normal.       Assessment:     Node positive proximal sigmoid colon cancer status post polypectomy and followup robotically assisted colectomy, recovering well only 10 days status post surgery     Plan:     Increase activity as tolerated to regular activity.  Low impact exercise such as walking an hour a day at least ideal.  Do not push through pain.  Diet as tolerated.  Low fat high fiber diet ideal.  Bowel regimen with 30 g fiber a day and fiber supplement as needed to avoid problems.  Colonoscopy in one year to make sure she does not guarding and polyps/cancers.  Medical oncology referral since she is no positive to consider post adjuvant chemotherapy given her age.  A copy of the pathology report was given to her.  We will discuss her on the gastrointestinal multidisciplinary tumor board as well.  Return to clinic in 3 weeks, sooner as needed.   Instructions discussed.  Followup with primary care physician for other health issues as would normally be done.  Consider screening for malignancies (breast, prostate, colon, melanoma, etc) as appropriate.  The biopsies of her thyroid  nodules were benign.  Have endocrinology follow these.  She does not have any symptoms of globus or dysphagia.  Will hold off on any resection at this time.  Questions answered.  The patient expressed understanding and appreciation

## 2013-08-23 NOTE — Patient Instructions (Signed)
ABDOMINAL SURGERY: POST OP INSTRUCTIONS  1. DIET: Follow a light bland diet the first 24 hours after arrival home, such as soup, liquids, crackers, etc.  Be sure to include lots of fluids daily.  Avoid fast food or heavy meals as your are more likely to get nauseated.  Eat a low fat the next few days after surgery.   2. Take your usually prescribed home medications unless otherwise directed. 3. PAIN CONTROL: a. Pain is best controlled by a usual combination of three different methods TOGETHER: i. Ice/Heat ii. Over the counter pain medication iii. Prescription pain medication b. Most patients will experience some swelling and bruising around the incisions.  Ice packs or heating pads (30-60 minutes up to 6 times a day) will help. Use ice for the first few days to help decrease swelling and bruising, then switch to heat to help relax tight/sore spots and speed recovery.  Some people prefer to use ice alone, heat alone, alternating between ice & heat.  Experiment to what works for you.  Swelling and bruising can take several weeks to resolve.   c. It is helpful to take an over-the-counter pain medication regularly for the first few weeks.  Choose one of the following that works best for you: i. Naproxen (Aleve, etc)  Two 262m tabs twice a day ii. Ibuprofen (Advil, etc) Three 2066mtabs four times a day (every meal & bedtime) iii. Acetaminophen (Tylenol, etc) 500-65011mour times a day (every meal & bedtime) d. A  prescription for pain medication (such as oxycodone, hydrocodone, etc) should be given to you upon discharge.  Take your pain medication as prescribed.  i. If you are having problems/concerns with the prescription medicine (does not control pain, nausea, vomiting, rash, itching, etc), please call us [phone] see if we need to switch you to a different pain medicine that will work better for you and/or control your side effect better. ii. If you need a refill on your pain medication,  please contact your pharmacy.  They will contact our office to request authorization. Prescriptions will not be filled after 5 pm or on week-ends. 4. Avoid getting constipated.  Between the surgery and the pain medications, it is common to experience some constipation.  Increasing fluid intake and taking a fiber supplement (such as Metamucil, Citrucel, FiberCon, MiraLax, etc) 1-2 times a day regularly will usually help prevent this problem from occurring.  A mild laxative (prune juice, Milk of Magnesia, MiraLax, etc) should be taken according to package directions if there are no bowel movements after 48 hours.   5. Watch out for diarrhea.  If you have many loose bowel movements, simplify your diet to bland foods & liquids for a few days.  Stop any stool softeners and decrease your fiber supplement.  Switching to mild anti-diarrheal medications (Kayopectate, Pepto Bismol) can help.  If this worsens or does not improve, please call us.[place]. Wash / shower every day.  You may shower over the incision / wound.  Avoid baths until the skin is fully healed.  Continue to shower over incision(s) after the dressing is off. 7. Remove your waterproof bandages 5 days after surgery.  You may leave the incision open to air.  You may replace a dressing/Band-Aid to cover the incision for comfort if you wish. 8. ACTIVITIES as tolerated:   a. You may resume regular (light) daily activities beginning the next day-such as daily self-care, walking, climbing stairs-gradually increasing activities as tolerated.  If you can  walk 30 minutes without difficulty, it is safe to try more intense activity such as jogging, treadmill, bicycling, low-impact aerobics, swimming, etc. b. Save the most intensive and strenuous activity for last such as sit-ups, heavy lifting, contact sports, etc  Refrain from any heavy lifting or straining until you are off narcotics for pain control.   c. DO NOT PUSH THROUGH PAIN.  Let pain be your guide: If it  hurts to do something, don't do it.  Pain is your body warning you to avoid that activity for another week until the pain goes down. d. You may drive when you are no longer taking prescription pain medication, you can comfortably wear a seatbelt, and you can safely maneuver your car and apply brakes. e. Dennis Bast may have sexual intercourse when it is comfortable.  9. FOLLOW UP in our office a. Please call [facility] at [phone] to set up an appointment to see your surgeon in the office for a follow-up appointment approximately 1-2 weeks after your surgery. b. Make sure that you call for this appointment the day you arrive home to insure a convenient appointment time. 10. IF YOU HAVE DISABILITY OR FAMILY LEAVE FORMS, BRING THEM TO THE OFFICE FOR PROCESSING.  DO NOT GIVE THEM TO YOUR DOCTOR.   WHEN TO CALL us [phone]: 1. Poor pain control 2. Reactions / problems with new medications (rash/itching, nausea, etc)  3. Fever over 101.5 F (38.5 C) 4. Inability to urinate 5. Nausea and/or vomiting 6. Worsening swelling or bruising 7. Continued bleeding from incision. 8. Increased pain, redness, or drainage from the incision  The clinic staff is available to answer your questions during regular business hours (8:30am-5pm).  Please don't hesitate to call and ask to speak to one of our nurses for clinical concerns.   A surgeon from [facility] is always on call at the hospitals   If you have a medical emergency, go to the nearest emergency room or call 911.    [facility], [address], [place], [place], [place]  [address] ? MAIN: [phone] ? TOLL FREE: [phone] ? FAX [phone] [email or web address]   Managing Pain  Pain after surgery or related to activity is often due to strain/injury to muscle, tendon, nerves and/or incisions.  This pain is usually short-term and will improve in a few months.   Many people find it helpful to do  the following things TOGETHER to help speed the process of healing and to get back to regular activity more quickly:  1. Avoid heavy physical activity a.  no lifting greater than 20 pounds b. Do not "push through" the pain.  Listen to your body and avoid positions and maneuvers than reproduce the pain c. Walking is okay as tolerated, but go slowly and stop when getting sore.  d. Remember: If it hurts to do it, then don't do it! 2. Take Anti-inflammatory medication  a. Take with food/snack around the clock for 1-2 weeks i. This helps the muscle and nerve tissues become less irritable and calm down faster b. Choose ONE of the following over-the-counter medications: i. Naproxen 220mg  tabs (ex. Aleve) 1-2 pills twice a day  ii. Ibuprofen 200mg  tabs (ex. Advil, Motrin) 3-4 pills with every meal and just before bedtime iii. Acetaminophen 500mg  tabs (Tylenol) 1-2 pills with every meal and just before bedtime 3. Use a Heating pad or Ice/Cold Pack a. 4-6 times a day b. May use warm bath/hottub  or showers 4.  Try Gentle Massage and/or Stretching  a. at the area of pain many times a day b. stop if you feel pain - do not overdo it  Try these steps together to help you body heal faster and avoid making things get worse.  Doing just one of these things may not be enough.    If you are not getting better after two weeks or are noticing you are getting worse, contact our office for further advice; we may need to re-evaluate you & see what other things we can do to help.   GETTING TO GOOD BOWEL HEALTH. Irregular bowel habits such as constipation and diarrhea can lead to many problems over time.  Having one soft bowel movement a day is the most important way to prevent further problems.  The anorectal canal is designed to handle stretching and feces to safely manage our ability to get rid of solid waste (feces, poop, stool) out of our body.  BUT, hard constipated stools can act like ripping concrete bricks  and diarrhea can be a burning fire to this very sensitive area of our body, causing inflamed hemorrhoids, anal fissures, increasing risk is perirectal abscesses, abdominal pain/bloating, an making irritable bowel worse.     The goal: ONE SOFT BOWEL MOVEMENT A DAY!  To have soft, regular bowel movements:    Drink at least 8 tall glasses of water a day.     Take plenty of fiber.  Fiber is the undigested part of plant food that passes into the colon, acting s "natures broom" to encourage bowel motility and movement.  Fiber can absorb and hold large amounts of water. This results in a larger, bulkier stool, which is soft and easier to pass. Work gradually over several weeks up to 6 servings a day of fiber (25g a day even more if needed) in the form of: o Vegetables -- Root (potatoes, carrots, turnips), leafy green (lettuce, salad greens, celery, spinach), or cooked high residue (cabbage, broccoli, etc) o Fruit -- Fresh (unpeeled skin & pulp), Dried (prunes, apricots, cherries, etc ),  or stewed ( applesauce)  o Whole grain breads, pasta, etc (whole wheat)  o Bran cereals    Bulking Agents -- This type of water-retaining fiber generally is easily obtained each day by one of the following:  o Psyllium bran -- The psyllium plant is remarkable because its ground seeds can retain so much water. This product is available as Metamucil, Konsyl, Effersyllium, Per Diem Fiber, or the less expensive generic preparation in drug and health food stores. Although labeled a laxative, it really is not a laxative.  o Methylcellulose -- This is another fiber derived from wood which also retains water. It is available as Citrucel. o Polyethylene Glycol - and "artificial" fiber commonly called Miralax or Glycolax.  It is helpful for people with gassy or bloated feelings with regular fiber o Flax Seed - a less gassy fiber than psyllium   No reading or other relaxing activity while on the toilet. If bowel movements take longer  than 5 minutes, you are too constipated   AVOID CONSTIPATION.  High fiber and water intake usually takes care of this.  Sometimes a laxative is needed to stimulate more frequent bowel movements, but    Laxatives are not a good long-term solution as it can wear the colon out. o Osmotics (Milk of Magnesia, Fleets phosphosoda, Magnesium citrate, MiraLax, GoLytely) are safer than  o Stimulants (Senokot, Castor Oil, Dulcolax, Ex Lax)    o  Do not take laxatives for more than 7days in a row.    IF SEVERELY CONSTIPATED, try a Bowel Retraining Program: o Do not use laxatives.  o Eat a diet high in roughage, such as bran cereals and leafy vegetables.  o Drink six (6) ounces of prune or apricot juice each morning.  o Eat two (2) large servings of stewed fruit each day.  o Take one (1) heaping tablespoon of a psyllium-based bulking agent twice a day. Use sugar-free sweetener when possible to avoid excessive calories.  o Eat a normal breakfast.  o Set aside 15 minutes after breakfast to sit on the toilet, but do not strain to have a bowel movement.  o If you do not have a bowel movement by the third day, use an enema and repeat the above steps.    Controlling diarrhea o Switch to liquids and simpler foods for a few days to avoid stressing your intestines further. o Avoid dairy products (especially milk & ice cream) for a short time.  The intestines often can lose the ability to digest lactose when stressed. o Avoid foods that cause gassiness or bloating.  Typical foods include beans and other legumes, cabbage, broccoli, and dairy foods.  Every person has some sensitivity to other foods, so listen to our body and avoid those foods that trigger problems for you. o Adding fiber (Citrucel, Metamucil, psyllium, Miralax) gradually can help thicken stools by absorbing excess fluid and retrain the intestines to act more normally.  Slowly increase the dose over a few weeks.  Too much fiber too soon can backfire and  cause cramping & bloating. o Probiotics (such as active yogurt, Align, etc) may help repopulate the intestines and colon with normal bacteria and calm down a sensitive digestive tract.  Most studies show it to be of mild help, though, and such products can be costly. o Medicines:   Bismuth subsalicylate (ex. Kayopectate, Pepto Bismol) every 30 minutes for up to 6 doses can help control diarrhea.  Avoid if pregnant.   Loperamide (Immodium) can slow down diarrhea.  Start with two tablets (4mg  total) first and then try one tablet every 6 hours.  Avoid if you are having fevers or severe pain.  If you are not better or start feeling worse, stop all medicines and call your doctor for advice o Call your doctor if you are getting worse or not better.  Sometimes further testing (cultures, endoscopy, X-ray studies, bloodwork, etc) may be needed to help diagnose and treat the cause of the diarrhea.  Colorectal Cancer Colorectal cancer is an abnormal growth of tissue (tumor) in the colon or rectum that is cancerous (malignant). Unlike noncancerous (benign) tumors, malignant tumors can spread to other parts of your body. The colon is the large bowel or large intestine. The rectum is the last several inches of the colon.  RISK FACTORS The exact cause of colorectal cancer is unknown. However, the following factors may increase your chances of getting colorectal cancer:   Age older than 40 years.   Abnormal growths (polyps) on the inner wall of the colon or rectum.   Diabetes.   African American race.   Family history of hereditary nonpolyposis colorectal cancer. This condition is caused by changes in the genes that are responsible for repairing mismatched DNA.   Personal history of cancer. A person who has already had colorectal cancer may develop it a second time. Also, women with a history of ovarian, uterine, or breast cancer are at  a somewhat higher risk of developing colorectal cancer.  Certain  hereditary conditions.  Eating a diet that is high in fat (especially animal fat) and low in fiber, fruits, and vegetables.  Sedentary lifestyle.  Inflammatory bowel disease, including ulcerative colitis and Crohn disease.   Smoking.   Excessive alcohol use.  SYMPTOMS Early colorectal cancer often does not cause symptoms. As the cancer grows, symptoms may include:   Changes in bowel habits.  Diarrhea.   Constipation.   Feeling like the bowel does not empty completely after a bowel movement.   Blood in the stool.   Stools that are narrower than usual.   Abdominal discomfort, pain, bloating, fullness, or cramps.  Frequent gas pain.   Unexplained weight loss.   Constant tiredness.   Nausea and vomiting.  DIAGNOSIS  Your health care provider will ask about your medical history. He or she may also perform a number of procedures, such as:   A physical exam.  A digital rectal exam.  A fecal occult blood test.  A barium enema.  Blood tests.   X-rays.   Imaging tests, such as CT scans or MRIs.   Taking a tissue sample (biopsy) from your colon or rectum to look for cancer cells.   A sigmoidoscopy to view the inside of the last part of your colon.   A colonoscopy to view the inside of your entire colon.   An endorectal ultrasound to see how deep a rectal tumor has grown and whether the cancer has spread to lymph nodes or other nearby tissues.  Your cancer will be staged to determine its severity and extent. Staging is a careful attempt to find out the size of the tumor, whether the cancer has spread, and if so, to what parts of the body. You may need to have more tests to determine the stage of your cancer. The test results will help determine what treatment plan is best for you.   Stage 0 The cancer is found only in the innermost lining of the colon or rectum.   Stage I The cancer has grown into the inner wall of the colon or rectum. The  cancer has not yet reached the outer wall of the colon.   Stage II The cancer extends more deeply into or through the wall of the colon or rectum. It may have invaded nearby tissue, but cancer cells have not spread to the lymph nodes.   Stage III The cancer has spread to nearby lymph nodes but not to other parts of the body.   Stage IV The cancer has spread to other parts of the body, such as the liver or lungs.  Your health care provider may tell you the detailed stage of your cancer, which includes both a number and a letter.  TREATMENT  Depending on the type and stage, colorectal cancer may be treated with surgery, radiation therapy, chemotherapy, targeted therapy, or radiofrequency ablation. Some people have a combination of these therapies. Surgery may be done to remove the polyps from your colon. In early stages, your health care provider may be able to do this during a colonoscopy. In later stages, surgery may be done to remove part of your colon.  HOME CARE INSTRUCTIONS   Only take over-the-counter or prescription medicines for pain, discomfort, or fever as directed by your health care provider.   Maintain a healthy diet.   Consider joining a support group. This may help you learn to cope with the stress of having  colorectal cancer.   Seek advice to help you manage treatment of side effects.   Keep all follow-up appointments as directed by your health care provider.   Inform your cancer specialist if you are admitted to the hospital.  SEEK MEDICAL CARE IF:  Your diarrhea or constipation does not go away.   Your bowel habits change.  You have increased abdominal pain.   You notice new fatigue or weakness.  You lose weight. Document Released: 06/27/2005 Document Revised: 02/27/2013 Document Reviewed: 12/20/2012 [facility] Patient Information 2014 [place].

## 2013-08-27 ENCOUNTER — Telehealth: Payer: Self-pay | Admitting: *Deleted

## 2013-08-27 NOTE — Telephone Encounter (Signed)
Spoke with patient and confirmed appointment with Dr. Benay Spice for 09/05/13.  Contact names, numbers, and directions were provided.  Questions were answered.,

## 2013-09-05 ENCOUNTER — Encounter: Payer: Self-pay | Admitting: Oncology

## 2013-09-05 ENCOUNTER — Ambulatory Visit (HOSPITAL_BASED_OUTPATIENT_CLINIC_OR_DEPARTMENT_OTHER): Payer: BC Managed Care – PPO | Admitting: Oncology

## 2013-09-05 ENCOUNTER — Other Ambulatory Visit: Payer: Self-pay | Admitting: *Deleted

## 2013-09-05 ENCOUNTER — Telehealth: Payer: Self-pay | Admitting: Oncology

## 2013-09-05 ENCOUNTER — Telehealth: Payer: Self-pay | Admitting: *Deleted

## 2013-09-05 ENCOUNTER — Encounter (INDEPENDENT_AMBULATORY_CARE_PROVIDER_SITE_OTHER): Payer: Self-pay

## 2013-09-05 ENCOUNTER — Ambulatory Visit: Payer: BC Managed Care – PPO

## 2013-09-05 VITALS — BP 176/77 | HR 101 | Temp 99.1°F | Resp 18 | Ht 66.0 in | Wt 166.7 lb

## 2013-09-05 DIAGNOSIS — I10 Essential (primary) hypertension: Secondary | ICD-10-CM

## 2013-09-05 DIAGNOSIS — E039 Hypothyroidism, unspecified: Secondary | ICD-10-CM

## 2013-09-05 DIAGNOSIS — D34 Benign neoplasm of thyroid gland: Secondary | ICD-10-CM

## 2013-09-05 DIAGNOSIS — C187 Malignant neoplasm of sigmoid colon: Secondary | ICD-10-CM

## 2013-09-05 MED ORDER — CAPECITABINE 500 MG PO TABS: ORAL_TABLET | ORAL | Status: DC

## 2013-09-05 NOTE — Progress Notes (Signed)
Checked in new patient with no financial issues.  °

## 2013-09-05 NOTE — Telephone Encounter (Signed)
gv and printed aptp sched and avs for pt for March °

## 2013-09-05 NOTE — [facility] Oncology Navigator Note (Signed)
Met with Christine Shepherd and family. Explained role of nurse navigator. Educational information provided on colon cancer  [place] resources provided to patient, including SW service and GI support group information.  Contact names and phone numbers were provided for entire [facility] team.  Teach back method was used.  No barriers to care identified.  Will continue to follow as needed.

## 2013-09-05 NOTE — Telephone Encounter (Signed)
Per Dr. Benay Spice, request made to J. Epps and K. Sharlett Iles in pathology to have pathologist  perform mismatch repair protein expression and MSI testing on the polypectomy biopsy specimen.  Accession # [id number].

## 2013-09-05 NOTE — Progress Notes (Signed)
[facility] Patient Consult   Referring MD:   SHAWANA KNOCH 63 y.o.  06/13/1951    Reason for Referral: Colon cancer     HPI: She was taken to a routine surveillance colonoscopy on 06/28/2013 by Dr. Olevia Perches. 3 polyps were found in the sigmoid colon a 5 mm sessile polyp was removed at 50 cm, at 25 cm there was a large fungating polypoid lesion that was resected piecemeal. The base of the polyp could not be completely removed. Biopsies were taken from the base. A polyp at 20 cm was not removed  The pathology ([id number]) confirmed an invasive well-differentiated adenocarcinoma arising in the background of a tubular adenoma with high-grade dysplasia. No lymphovascular invasion was identified. Invasive tumor was present at the cauterized tissue adjacent to the polypectomy margin. She underwent a sigmoidoscopy on 07/22/2013. Ulcerated sessile polyp was found in the sigmoid colon at 50 cm. It was noted this was the same polyp previously located 25 cm. The lesion was biopsied and tattooed. No other polyps were found. The pathology ([id number]) confirmed invasive adenocarcinoma. Tumor was present at the cauterized edges.  She was referred for a CT of the chest on 07/24/2013. Diffuse enlargement of the thyroid was noted with extension of the right lobe into the upper chest. No evidence of metastatic disease in the chest. CTs of the abdomen and pelvis on 07/03/2013 revealed no focal liver abnormality. An Endo Clip was identified at a biopsy site in the sigmoid colon. Mild circumferential wall thickening at this level. No evidence of metastatic disease.  She was referred to Dr. Johney Maine. A thyroid ultrasound 08/07/2048 revealed multiple nodules. Biopsies of left and right thyroid nodules on 08/07/2013 revealed a benign thyroid nodule on the left and a benign thyroid nodule on the right.  She was taken to the operating room on 08/14/2013 and underwent a laparoscopic sigmoid colectomy.  Tattooing was noted at the descending/sigmoid colon junction with irregularity in the mucosa. No evidence of metastatic disease. The anastomosis was measured at 16 cm from the anal verge.  The pathology ([id number]) revealed a tubulovillous adenoma with associated inflammation, fibrosis, and tattoo pigment. No residual invasive carcinoma was identified. Metastatic carcinoma was seen in 1 of 11 lymph nodes. No lymphovascular invasion was identified on the 06/28/2013 biopsy specimen. The tumor was well-differentiated. The tumor is felt to represent a T1 or T2 lesion. Multiple additional hyperplastic polyps were noted in the sigmoid resection specimen.  She reports an uneventful operative recovery. She felt well prior to surgery.  Past Medical History  Diagnosis Date  . Hypertension   .  thyroid nodules-benign nodules on biopsy of left and right nodules  08/07/2013   . Gallstones   . Acute cholecystitis with chronic cholecystitis s/p lap chole [id number] 11/15/2011  . Complication of anesthesia 06/27/2013    nausea  . Hypothyroidism     .   G1 P1  Past Surgical History  Procedure Laterality Date  . Dilation and curettage of uterus    . Tubal ligation    . Flexible sigmoidoscopy N/A 07/22/2013    Procedure: FLEXIBLE SIGMOIDOSCOPY;  Surgeon: Lafayette Dragon, MD;  Location: [facility];  Service: Endoscopy;  Laterality: N/A;  ja/    tatoo  . Abdominal hysterectomy      hx. fibroids  . Cholecystectomy  11/17/11    laparoscopic    Family History  Problem Relation Age of Onset  . Cancer Father     Prostate Ca  . Cancer  Brother  23     unknown  . Colon cancer Neg Hx   2 brothers, 3 sisters, no other family history of cancer  Current outpatient prescriptions:acetaminophen (TYLENOL) 325 MG tablet, Take 650 mg by mouth every 6 (six) hours as needed., Disp: , Rfl: ;  estradiol (VIVELLE-DOT) 0.05 MG/24HR, Place 1 patch onto the skin 2 (two) times a week. Tuesday and fridays, Disp: , Rfl: ;   levothyroxine (SYNTHROID, LEVOTHROID) 75 MCG tablet, Take 75 mcg by mouth daily with breakfast. , Disp: , Rfl:  valACYclovir (VALTREX) 1000 MG tablet, Take 1,000 mg by mouth daily as needed (outbreaks). , Disp: , Rfl: ;  valsartan-hydrochlorothiazide (DIOVAN-HCT) 160-12.5 MG per tablet, Take 1 tablet by mouth every morning. , Disp: , Rfl: ;  ibuprofen (ADVIL,MOTRIN) 200 MG tablet, Take 200-400 mg by mouth every 6 (six) hours as needed for headache or moderate pain. Pain, Disp: , Rfl:  Multiple Vitamin (MULITIVITAMIN WITH MINERALS) TABS, Take 1 tablet by mouth daily., Disp: , Rfl: ;  ondansetron (ZOFRAN) 4 MG tablet, Take 1 tablet (4 mg total) by mouth every 8 (eight) hours as needed for nausea or vomiting., Disp: 20 tablet, Rfl: 0;  promethazine (PHENERGAN) 12.5 MG tablet, Take 1 tablet (12.5 mg total) by mouth every 6 (six) hours as needed for nausea or vomiting., Disp: 30 tablet, Rfl: 0  Allergies: No Known Allergies  Social History: She works in a factory. She does not use tobacco or alcohol. No transfusion history. No risk factor for HIV or hepatitis.  ROS:   Positives include: Bleeding from the rectum after she has constipation  A complete ROS was otherwise negative.  Physical Exam:  Blood pressure 176/77, pulse 101, temperature 99.1 F (37.3 C), temperature source Oral, resp. rate 18, height _0  (1.676 m), weight 166 lb 11.2 oz (75.615 kg), SpO2 98.00%.  HEENT: Oropharynx without visible mass, upper denture plate, neck without mass Lungs: Clear bilaterally Cardiac: Regular rate and rhythm Abdomen: No hepatosplenomegaly, no mass, healed surgical incisions  Vascular: No leg edema Lymph nodes: No cervical, supraclavicular, axillary, or inguinal nodes Neurologic: Alert and oriented, the motor exam appears intact in the upper and lower extremities Skin: No rash Musculoskeletal: No spine tenderness   LAB:  CBC  Lab Results  Component Value Date   WBC 12.1* 08/14/2013   HGB 9.8*  08/14/2013   HCT 31.1* 08/14/2013   MCV 88.6 08/14/2013   PLT 203 08/14/2013   NEUTROABS 3.5 07/02/2013   07/02/2013-hemoglobin 12.7, MCV 88.9, ANC 3.5  CMP      Component Value Date/Time   NA 140 08/14/2013 0514   K 4.8 08/14/2013 0514   CL 106 08/14/2013 0514   CO2 25 08/14/2013 0514   GLUCOSE 109* 08/14/2013 0514   BUN 8 08/14/2013 0514   CREATININE 0.82 08/14/2013 0514   CALCIUM 8.4 08/14/2013 0514   PROT 7.3 07/02/2013 1518   ALBUMIN 4.2 07/02/2013 1518   AST 16 07/02/2013 1518   ALT 14 07/02/2013 1518   ALKPHOS 42 07/02/2013 1518   BILITOT 0.7 07/02/2013 1518   GFRNONAA 75* 08/14/2013 0514   GFRAA 87* 08/14/2013 0514   07/02/2013-CEA  1.0  Radiology: As per history of present illness    Assessment/Plan:   1. 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 History of multiple colon polyps  3. Thyromegaly with multiple thyroid nodules-status post right and left thyroid nodule biopsies  on 08/07/2013-benign  4. Hypothyroidism  5. Hypertension   Disposition:   Ms. Lanius has been diagnosed with stage III colon cancer. I discussed the diagnosis, prognosis, and adjuvant treatment options with Ms. Chestang and her husband. We reviewed the details of the surgical pathology report. Her prognosis appears better than the average stage III patient based on the low histologic grade, single involved lymph node, lack of lymphovascular invasion, and early T stage.  We discussed the benefit of adjuvant 5-fluorouracil therapy in patients with resected stage III colon cancer. We also discussed the small expected benefit with the addition of oxaliplatin. She does not wish to consider oxaliplatin chemotherapy.  I recommend adjuvant capecitabine. We reviewed the potential toxicities associated with capecitabine including the chance for nausea, mucositis, diarrhea, and hematologic toxicity. We discussed  the rash, hyperpigmentation, and hand/foot syndrome associated with capecitabine. She will attend a chemotherapy teaching class.  The plan is to begin adjuvant capecitabine on 09/13/2013. We will ask for mismatch repair protein expression and MSI testing on the polypectomy biopsy specimen.  Ms. Andringa will return for an office visit on 10/01/2013.  She should undergo a surveillance colonoscopy in one year.  Approximately 50 minutes were spent with patient today. The majority of the time was used for counseling and coordination of care.  [place], [place] 09/05/2013, 2:31 PM

## 2013-09-06 ENCOUNTER — Encounter: Payer: Self-pay | Admitting: Oncology

## 2013-09-06 NOTE — Progress Notes (Signed)
Faxed xeloda prescription to Melba [facility] @ [place] [phone]

## 2013-09-10 ENCOUNTER — Telehealth (INDEPENDENT_AMBULATORY_CARE_PROVIDER_SITE_OTHER): Payer: Self-pay | Admitting: *Deleted

## 2013-09-10 ENCOUNTER — Other Ambulatory Visit: Payer: BC Managed Care – PPO

## 2013-09-10 ENCOUNTER — Encounter: Payer: Self-pay | Admitting: *Deleted

## 2013-09-10 NOTE — Telephone Encounter (Signed)
Spoke with the operator at [facility] and pt is scheduled on 11/12/13 @ 12:00pm with Dr. Buddy Duty.  They have called and made pt aware.

## 2013-09-11 ENCOUNTER — Encounter: Payer: Self-pay | Admitting: *Deleted

## 2013-09-11 ENCOUNTER — Telehealth: Payer: Self-pay | Admitting: *Deleted

## 2013-09-11 NOTE — Telephone Encounter (Signed)
Message copied by Hulan Saas on Wed Sep 11, 2013  1:09 PM ------      Message from: Gaylyn Lambert      Created: Wed Sep 11, 2013 12:50 PM       Yes I apologize, it is needed in a year.  Would that be scheduled now or would she be put on a recall list?            Thank you,      Stevie            ----- Message -----         From: Hulan Saas, RN         Sent: 09/11/2013  11:52 AM           To: Michell Heinrich,      Do you mean scheduled in one year?      Rollene Fare      ----- Message -----         From: Gaylyn Lambert         Sent: 09/11/2013  11:43 AM           To: Hulan Saas, RN            [facility],             This is a patient of Dr. Olevia Perches and she is needing to be scheduled for a colonscopy.  She is s/p colectomy on 08/13/13.  Let me know if you have any further questions.            Thank you,      Stevie             ------

## 2013-09-11 NOTE — Telephone Encounter (Signed)
error 

## 2013-09-11 NOTE — Telephone Encounter (Signed)
Spoke with patient by phone.  She does not know where Xeloda is being distributed from.  She has not heard from anyone and is supposed to start on 09/13/13.  Left message with Charlena Cross, drug specialist.

## 2013-09-12 ENCOUNTER — Ambulatory Visit (INDEPENDENT_AMBULATORY_CARE_PROVIDER_SITE_OTHER): Payer: BC Managed Care – PPO | Admitting: Surgery

## 2013-09-12 ENCOUNTER — Encounter (INDEPENDENT_AMBULATORY_CARE_PROVIDER_SITE_OTHER): Payer: Self-pay | Admitting: Surgery

## 2013-09-12 VITALS — BP 162/82 | HR 70 | Resp 16 | Ht 66.0 in | Wt 170.0 lb

## 2013-09-12 DIAGNOSIS — K429 Umbilical hernia without obstruction or gangrene: Secondary | ICD-10-CM

## 2013-09-12 DIAGNOSIS — E042 Nontoxic multinodular goiter: Secondary | ICD-10-CM

## 2013-09-12 DIAGNOSIS — K432 Incisional hernia without obstruction or gangrene: Secondary | ICD-10-CM

## 2013-09-12 DIAGNOSIS — C187 Malignant neoplasm of sigmoid colon: Secondary | ICD-10-CM

## 2013-09-12 NOTE — Progress Notes (Signed)
Subjective:     Patient ID: Christine Shepherd, female   DOB: 1951/02/24, 63 y.o.   MRN: [id number]  HPI   Note: This dictation was prepared with Dragon/digital dictation along with [employer]. Any transcriptional errors that result from this process are unintentional.       Christine Shepherd  08-15-1950 [id number]  Patient Care Team: Criselda Peaches, MD as PCP - General (Internal Medicine) Fara Chute, PA-C as Physician Assistant (Physician Assistant) Adin Hector, MD as Consulting Physician (General Surgery) Lafayette Dragon, MD as Consulting Physician (Gastroenterology) [name], MD as Consulting Physician (Oncology)  Procedure (Date: 08/13/2013):  POST-OPERATIVE DIAGNOSIS:  Adenocarcinoma within a polyp of proximal sigmoid colon  Incisional VWH  Umbilical hernia   PROCEDURE: Procedure(s):  ROBOT ASSISTED LAPAROSCOPIC SIGMOID COLECTOMY  RIGID PROCTOSCOPY  LAP LYSIS OF ADHESIONS  PRIMARY REPAIR OF VENTRAL WALL HERNIA X1  PRIMARY REPAIR OF UMBILICAL HERNIA   SURGEON: Surgeon(s):  Adin Hector, MD  Leighton Ruff, MD Asst   This patient returns for surgical re-evaluation.  She feels doubly better.  She saw medical oncology.  Recommendation made for oral chemotherapy post adjuvant.   Sources that away.  She is back to wearing her jeans.  No fevers or chills.  Eating solid diet.  Taking Metamucil fiber supplement.  Energy level gradually returning.  Wishing to get back to her job next week.  No rectal bleeding.   Patient Active Problem List   Diagnosis Date Noted  . Umbilical hernia s/p primary repair 08/13/2013 08/16/2013  . Ventral incisional hernia s/p primary repair 08/13/2013 08/16/2013  . Cancer of sigmoid colon T2N1M0 s/p robotic colectomy 08/13/2013 07/17/2013  . Hypertension   . Multiple thyroid nodules     Past Medical History  Diagnosis Date  . Hypertension   . Thyroid disease   . Gallstones   . Acute cholecystitis with chronic cholecystitis s/p  lap chole [id number] 11/15/2011  . Complication of anesthesia 06/27/2013    nausea  . Hypothyroidism     Past Surgical History  Procedure Laterality Date  . Dilation and curettage of uterus    . Tubal ligation    . Flexible sigmoidoscopy N/A 07/22/2013    Procedure: FLEXIBLE SIGMOIDOSCOPY;  Surgeon: Lafayette Dragon, MD;  Location: [facility];  Service: Endoscopy;  Laterality: N/A;  ja/    tatoo  . Abdominal hysterectomy      hx. fibroids  . Cholecystectomy  11/17/11    laparoscopic    History   Social History  . Marital Status: Divorced    Spouse Name: N/A    Number of Children: N/A  . Years of Education: N/A   Occupational History  . Not on file.   Social History Main Topics  . Smoking status: Never Smoker   . Smokeless tobacco: Never Used  . Alcohol Use: No  . Drug Use: No  . Sexual Activity: Yes   Other Topics Concern  . Not on file   Social History Narrative  . No narrative on file    Family History  Problem Relation Age of Onset  . Cancer Father     Prostate Ca  . Cancer Brother     unknown  . Colon cancer Neg Hx     Current Outpatient Prescriptions  Medication Sig Dispense Refill  . acetaminophen (TYLENOL) 325 MG tablet Take 650 mg by mouth every 6 (six) hours as needed.      [name] estradiol (VIVELLE-DOT) 0.05 MG/24HR Place  1 patch onto the skin 2 (two) times a week. Tuesday and fridays      . ibuprofen (ADVIL,MOTRIN) 200 MG tablet Take 200-400 mg by mouth every 6 (six) hours as needed for headache or moderate pain. Pain      . levothyroxine (SYNTHROID, LEVOTHROID) 75 MCG tablet Take 75 mcg by mouth daily with breakfast.       . Multiple Vitamin (MULITIVITAMIN WITH MINERALS) TABS Take 1 tablet by mouth daily.      . valsartan-hydrochlorothiazide (DIOVAN-HCT) 160-12.5 MG per tablet Take 1 tablet by mouth every morning.       . ondansetron (ZOFRAN) 4 MG tablet Take 1 tablet (4 mg total) by mouth every 8 (eight) hours as needed for nausea or vomiting.  20 tablet   0  . promethazine (PHENERGAN) 12.5 MG tablet Take 1 tablet (12.5 mg total) by mouth every 6 (six) hours as needed for nausea or vomiting.  30 tablet  0  . valACYclovir (VALTREX) 1000 MG tablet Take 1,000 mg by mouth daily as needed (outbreaks).        No current facility-administered medications for this visit.     No Known Allergies  BP 162/82  Pulse 70  Resp 16  Ht 5\' 6"  (1.676 m)  Wt 170 lb (77.111 kg)  BMI 27.45 kg/m2  Ct Chest W Contrast  07/24/2013   CLINICAL DATA:  New diagnosis of sigmoid colon carcinoma. Evaluate for metastasis. History of thyroid disease.  EXAM: CT CHEST WITH CONTRAST  TECHNIQUE: Multidetector CT imaging of the chest was performed during intravenous contrast administration.  CONTRAST:  55mL OMNIPAQUE IOHEXOL 300 MG/ML  SOLN  COMPARISON:  DG CHEST 2V dated 07/19/2013; CT ABD/PELVIS W CM dated 07/03/2013  FINDINGS: Lungs/Pleura:  No nodules or airspace opacities. No pleural fluid.  Heart/Mediastinum:  No supraclavicular adenopathy.  Relatively diffuse enlargement of the thyroid. The right lobe extends inferiorly into the upper chest. This nodule or extension measures 2.6 cm on image 8.  Aortic and branch vessel atherosclerosis. Normal heart size, without pericardial effusion. LAD coronary artery atherosclerosis on image 31. No mediastinal or hilar adenopathy.  Upper Abdomen:  Cholecystectomy.  Bones/Musculoskeletal:  No acute osseous abnormality.  IMPRESSION: 1.  No acute process or evidence of metastatic disease in the chest. 2. Age advanced coronary artery atherosclerosis. Recommend assessment of coronary risk factors and consideration of medical therapy. 3. Thyromegaly with a nodule or area of extension into the upper chest. Consider further evaluation with thyroid ultrasound.   Electronically Signed   By: Abigail Miyamoto M.D.   On: 07/24/2013 14:55   US Soft Tissue Head/neck  08/07/2013   CLINICAL DATA:  Multinodular goiter. Right substernal mass noted on recent CT chest.   EXAM: THYROID ULTRASOUND  TECHNIQUE: Ultrasound examination of the thyroid gland and adjacent soft tissues was performed.  COMPARISON:  CT 07/24/2013  FINDINGS: Right thyroid lobe  Measurements: 67 x 48 x 34 mm. Inhomogeneous background echotexture with multiple nodules.  44 x 26 x 26 mm solid, deep inferior pole, corresponding to lesion seen on CT, not discretely identified on previous exam  31 x 13 x 26 mm solid, superior pole (previously 13 x 14 x 19)  34 x 24 x 28 mm solid, mid lobe (previously 17 x 22 x 24)  Left thyroid lobe  Measurements: 61 x 23 x 28 mm. Inhomogeneous parenchyma with multiple nodules, largest  38 x 21 x 32 mm solid, mid lobe (previously 17 x 21)  13 x 13  x 14 mm solid, inferior pole  8 x 6 x 11 mm complex mostly solid, medial near isthmus  Isthmus  Thickness: 9 mm. There is a 15 x 10 x 14 mm complex mostly solid nodule just to the right of midline (previously 8 x 8 x 11)  Lymphadenopathy  None visualized.  IMPRESSION: 1. Thyromegaly with multiple nodules, all increased in size since previous exam. Findings meet consensus criteria for biopsy. Ultrasound-guided fine needle aspiration should be considered, and is scheduled, as per the consensus statement: Management of Thyroid Nodules Detected at [place]: Society of Radiologists in [place]. Radiology 2005; [id number].   Electronically Signed   By: Arne Cleveland M.D.   On: 08/07/2013 15:34   US Thyroid Biopsy  08/07/2013   CLINICAL DATA:  Thyromegaly with multiple enlarging thyroid nodules. Right substernal extension noted on recent CT.  EXAM: ULTRASOUND-GUIDED THYROID ASPIRATION BIOPSY x2  TECHNIQUE: The procedure, risks (including but not limited to bleeding, infection, organ damage ), benefits, and alternatives were explained to the patient. Questions regarding the procedure were encouraged and answered. The patient understands and consents to the procedure.  Survey ultrasound was performed and the dominant  lesion in the deep inferior pole RIGHT lobe was localized. An appropriate skin entry site was determined. Skin was marked, then prepped with Betadine, draped in usual sterile fashion, and infiltrated locally with 1% lidocaine. Under real-time ultrasound guidance, 3 passes were made into the lesion with 21 gauge Inrad needles.  In similar fashion, the dominant lesion in the mid LEFT lobe was localized. An appropriate skin entry site was determined. Skin was marked, then prepped with Betadine, draped in usual sterile fashion, and infiltrated locally with 1% lidocaine. Under real-time ultrasound guidance, 3 passes were made into the lesion with 25 gauge needles.  The patient tolerated procedures well, with no immediate complications.  IMPRESSION: 1. Technically successful ultrasound-guided thyroid aspiration biopsy , dominant RIGHT deep inferior pole lesion. 2. Technically successful ultrasound-guided thyroid FNA biopsy of dominant mid LEFT lesion.   Electronically Signed   By: Arne Cleveland M.D.   On: 08/07/2013 15:37   US Thyroid Biopsy  08/07/2013   CLINICAL DATA:  Thyromegaly with multiple enlarging thyroid nodules. Right substernal extension noted on recent CT.  EXAM: ULTRASOUND-GUIDED THYROID ASPIRATION BIOPSY x2  TECHNIQUE: The procedure, risks (including but not limited to bleeding, infection, organ damage ), benefits, and alternatives were explained to the patient. Questions regarding the procedure were encouraged and answered. The patient understands and consents to the procedure.  Survey ultrasound was performed and the dominant lesion in the deep inferior pole RIGHT lobe was localized. An appropriate skin entry site was determined. Skin was marked, then prepped with Betadine, draped in usual sterile fashion, and infiltrated locally with 1% lidocaine. Under real-time ultrasound guidance, 3 passes were made into the lesion with 21 gauge Inrad needles.  In similar fashion, the dominant lesion in the mid  LEFT lobe was localized. An appropriate skin entry site was determined. Skin was marked, then prepped with Betadine, draped in usual sterile fashion, and infiltrated locally with 1% lidocaine. Under real-time ultrasound guidance, 3 passes were made into the lesion with 25 gauge needles.  The patient tolerated procedures well, with no immediate complications.  IMPRESSION: 1. Technically successful ultrasound-guided thyroid aspiration biopsy , dominant RIGHT deep inferior pole lesion. 2. Technically successful ultrasound-guided thyroid FNA biopsy of dominant mid LEFT lesion.   Electronically Signed   By: Arne Cleveland M.D.   On:  08/07/2013 15:37    Review of Systems  Constitutional: Negative for fever, chills and diaphoresis.  HENT: Negative for drooling, ear pain, mouth sores, sore throat and trouble swallowing.   Eyes: Negative for photophobia and visual disturbance.  Respiratory: Negative for cough and choking.   Cardiovascular: Negative for chest pain and palpitations.  Gastrointestinal: Negative for nausea, vomiting, abdominal pain, diarrhea, constipation, anal bleeding and rectal pain.  Genitourinary: Negative for dysuria, frequency and difficulty urinating.  Musculoskeletal: Negative for gait problem and myalgias.  Skin: Negative for color change, pallor and rash.  Neurological: Negative for dizziness, speech difficulty, weakness and numbness.  Hematological: Negative for adenopathy.  Psychiatric/Behavioral: Negative for confusion and agitation. The patient is not nervous/anxious.        Objective:   Physical Exam  Constitutional: She is oriented to person, place, and time. She appears well-developed and well-nourished. No distress.  HENT:  Head: Normocephalic.  Mouth/Throat: Oropharynx is clear and moist. No oropharyngeal exudate.  Eyes: Conjunctivae and EOM are normal. Pupils are equal, round, and reactive to light. No scleral icterus.  Neck: Normal range of motion. No tracheal  deviation present.  Cardiovascular: Normal rate and intact distal pulses.   Pulmonary/Chest: Effort normal. No respiratory distress. She exhibits no tenderness.  Abdominal: Soft. She exhibits no distension. There is no tenderness. Hernia confirmed negative in the right inguinal area and confirmed negative in the left inguinal area.  Incisions clean with normal healing ridges.  No hernias  Genitourinary: No vaginal discharge found.  Musculoskeletal: Normal range of motion. She exhibits no tenderness.  Lymphadenopathy:       Right: No inguinal adenopathy present.       Left: No inguinal adenopathy present.  Neurological: She is alert and oriented to person, place, and time. No cranial nerve deficit. She exhibits normal muscle tone. Coordination normal.  Skin: Skin is warm and dry. No rash noted. She is not diaphoretic.  Psychiatric: She has a normal mood and affect. Her behavior is normal.       Assessment:     Node positive proximal sigmoid colon cancer status post polypectomy and followup robotically assisted colectomy, recovering well only 1 month status post surgery     Plan:     Increase activity as tolerated to regular activity.  Low impact exercise such as walking an hour a day at least ideal.  Do not push through pain.  Diet as tolerated.  Low fat high fiber diet ideal.  Bowel regimen with 30 g fiber a day and fiber supplement as needed to avoid problems.  Colonoscopy in one year to make sure she does not guarding and polyps/cancers.  Xeloda chemotherapy per medical oncology since she is lymph node positive.   A copy of the pathology report has been given to her.  Discussed her at the gastrointestinal multidisciplinary tumor board as well.  The biopsies of her thyroid nodules were benign.  Have endocrinology follow these.  She does not have any symptoms of globus or dysphagia.  Will hold off on any resection at this time.  Return to clinic as needed.   Instructions discussed.   Followup with primary care physician for other health issues as would normally be done.  Consider screening for malignancies (breast, prostate, colon, melanoma, etc) as appropriate.  Questions answered.  The patient expressed understanding and appreciation

## 2013-09-12 NOTE — Patient Instructions (Signed)
Please consider the recommendations that we have given you today:  Complete chemotherapy Xeloda pills per Dr. Benay Spice.  Call us or Marcellus Scott with questions.  Obtain colonoscopy one year out from surgery with Dr Nichola Sizer group to make sure you are not growing any new polyps/cancers.  See the Handout(s) we have given you.  Please call our office at [phone] if you wish to schedule surgery or if you have further questions / concerns.   GETTING TO GOOD BOWEL HEALTH. Irregular bowel habits such as constipation and diarrhea can lead to many problems over time.  Having one soft bowel movement a day is the most important way to prevent further problems.  The anorectal canal is designed to handle stretching and feces to safely manage our ability to get rid of solid waste (feces, poop, stool) out of our body.  BUT, hard constipated stools can act like ripping concrete bricks and diarrhea can be a burning fire to this very sensitive area of our body, causing inflamed hemorrhoids, anal fissures, increasing risk is perirectal abscesses, abdominal pain/bloating, an making irritable bowel worse.     The goal: ONE SOFT BOWEL MOVEMENT A DAY!  To have soft, regular bowel movements:    Drink at least 8 tall glasses of water a day.     Take plenty of fiber.  Fiber is the undigested part of plant food that passes into the colon, acting s "natures broom" to encourage bowel motility and movement.  Fiber can absorb and hold large amounts of water. This results in a larger, bulkier stool, which is soft and easier to pass. Work gradually over several weeks up to 6 servings a day of fiber (25g a day even more if needed) in the form of: o Vegetables -- Root (potatoes, carrots, turnips), leafy green (lettuce, salad greens, celery, spinach), or cooked high residue (cabbage, broccoli, etc) o Fruit -- Fresh (unpeeled skin & pulp), Dried (prunes, apricots, cherries, etc ),  or stewed ( applesauce)  o Whole grain breads,  pasta, etc (whole wheat)  o Bran cereals    Bulking Agents -- This type of water-retaining fiber generally is easily obtained each day by one of the following:  o Psyllium bran -- The psyllium plant is remarkable because its ground seeds can retain so much water. This product is available as Metamucil, Konsyl, Effersyllium, Per Diem Fiber, or the less expensive generic preparation in drug and health food stores. Although labeled a laxative, it really is not a laxative.  o Methylcellulose -- This is another fiber derived from wood which also retains water. It is available as Citrucel. o Polyethylene Glycol - and "artificial" fiber commonly called Miralax or Glycolax.  It is helpful for people with gassy or bloated feelings with regular fiber o Flax Seed - a less gassy fiber than psyllium   No reading or other relaxing activity while on the toilet. If bowel movements take longer than 5 minutes, you are too constipated   AVOID CONSTIPATION.  High fiber and water intake usually takes care of this.  Sometimes a laxative is needed to stimulate more frequent bowel movements, but    Laxatives are not a good long-term solution as it can wear the colon out. o Osmotics (Milk of Magnesia, Fleets phosphosoda, Magnesium citrate, MiraLax, GoLytely) are safer than  o Stimulants (Senokot, Castor Oil, Dulcolax, Ex Lax)    o Do not take laxatives for more than 7days in a row.    IF SEVERELY CONSTIPATED, try a  Bowel Retraining Program: o Do not use laxatives.  o Eat a diet high in roughage, such as bran cereals and leafy vegetables.  o Drink six (6) ounces of prune or apricot juice each morning.  o Eat two (2) large servings of stewed fruit each day.  o Take one (1) heaping tablespoon of a psyllium-based bulking agent twice a day. Use sugar-free sweetener when possible to avoid excessive calories.  o Eat a normal breakfast.  o Set aside 15 minutes after breakfast to sit on the toilet, but do not strain to have a  bowel movement.  o If you do not have a bowel movement by the third day, use an enema and repeat the above steps.    Controlling diarrhea o Switch to liquids and simpler foods for a few days to avoid stressing your intestines further. o Avoid dairy products (especially milk & ice cream) for a short time.  The intestines often can lose the ability to digest lactose when stressed. o Avoid foods that cause gassiness or bloating.  Typical foods include beans and other legumes, cabbage, broccoli, and dairy foods.  Every person has some sensitivity to other foods, so listen to our body and avoid those foods that trigger problems for you. o Adding fiber (Citrucel, Metamucil, psyllium, Miralax) gradually can help thicken stools by absorbing excess fluid and retrain the intestines to act more normally.  Slowly increase the dose over a few weeks.  Too much fiber too soon can backfire and cause cramping & bloating. o Probiotics (such as active yogurt, Align, etc) may help repopulate the intestines and colon with normal bacteria and calm down a sensitive digestive tract.  Most studies show it to be of mild help, though, and such products can be costly. o Medicines:   Bismuth subsalicylate (ex. Kayopectate, Pepto Bismol) every 30 minutes for up to 6 doses can help control diarrhea.  Avoid if pregnant.   Loperamide (Immodium) can slow down diarrhea.  Start with two tablets (4mg  total) first and then try one tablet every 6 hours.  Avoid if you are having fevers or severe pain.  If you are not better or start feeling worse, stop all medicines and call your doctor for advice o Call your doctor if you are getting worse or not better.  Sometimes further testing (cultures, endoscopy, X-ray studies, bloodwork, etc) may be needed to help diagnose and treat the cause of the diarrhea.  Colorectal Cancer Colorectal cancer is an abnormal growth of tissue (tumor) in the colon or rectum that is cancerous (malignant). Unlike  noncancerous (benign) tumors, malignant tumors can spread to other parts of your body. The colon is the large bowel or large intestine. The rectum is the last several inches of the colon.  RISK FACTORS The exact cause of colorectal cancer is unknown. However, the following factors may increase your chances of getting colorectal cancer:   Age older than 11 years.   Abnormal growths (polyps) on the inner wall of the colon or rectum.   Diabetes.   African American race.   Family history of hereditary nonpolyposis colorectal cancer. This condition is caused by changes in the genes that are responsible for repairing mismatched DNA.   Personal history of cancer. A person who has already had colorectal cancer may develop it a second time. Also, women with a history of ovarian, uterine, or breast cancer are at a somewhat higher risk of developing colorectal cancer.  Certain hereditary conditions.  Eating a diet that is  high in fat (especially animal fat) and low in fiber, fruits, and vegetables.  Sedentary lifestyle.  Inflammatory bowel disease, including ulcerative colitis and Crohn disease.   Smoking.   Excessive alcohol use.  SYMPTOMS Early colorectal cancer often does not cause symptoms. As the cancer grows, symptoms may include:   Changes in bowel habits.  Diarrhea.   Constipation.   Feeling like the bowel does not empty completely after a bowel movement.   Blood in the stool.   Stools that are narrower than usual.   Abdominal discomfort, pain, bloating, fullness, or cramps.  Frequent gas pain.   Unexplained weight loss.   Constant tiredness.   Nausea and vomiting.  DIAGNOSIS  Your health care provider will ask about your medical history. He or she may also perform a number of procedures, such as:   A physical exam.  A digital rectal exam.  A fecal occult blood test.  A barium enema.  Blood tests.   X-rays.   Imaging tests, such as  CT scans or MRIs.   Taking a tissue sample (biopsy) from your colon or rectum to look for cancer cells.   A sigmoidoscopy to view the inside of the last part of your colon.   A colonoscopy to view the inside of your entire colon.   An endorectal ultrasound to see how deep a rectal tumor has grown and whether the cancer has spread to lymph nodes or other nearby tissues.  Your cancer will be staged to determine its severity and extent. Staging is a careful attempt to find out the size of the tumor, whether the cancer has spread, and if so, to what parts of the body. You may need to have more tests to determine the stage of your cancer. The test results will help determine what treatment plan is best for you.   Stage 0 The cancer is found only in the innermost lining of the colon or rectum.   Stage I The cancer has grown into the inner wall of the colon or rectum. The cancer has not yet reached the outer wall of the colon.   Stage II The cancer extends more deeply into or through the wall of the colon or rectum. It may have invaded nearby tissue, but cancer cells have not spread to the lymph nodes.   Stage III The cancer has spread to nearby lymph nodes but not to other parts of the body.   Stage IV The cancer has spread to other parts of the body, such as the liver or lungs.  Your health care provider may tell you the detailed stage of your cancer, which includes both a number and a letter.  TREATMENT  Depending on the type and stage, colorectal cancer may be treated with surgery, radiation therapy, chemotherapy, targeted therapy, or radiofrequency ablation. Some people have a combination of these therapies. Surgery may be done to remove the polyps from your colon. In early stages, your health care provider may be able to do this during a colonoscopy. In later stages, surgery may be done to remove part of your colon.  HOME CARE INSTRUCTIONS   Only take over-the-counter or  prescription medicines for pain, discomfort, or fever as directed by your health care provider.   Maintain a healthy diet.   Consider joining a support group. This may help you learn to cope with the stress of having colorectal cancer.   Seek advice to help you manage treatment of side effects.   Keep all  follow-up appointments as directed by your health care provider.   Inform your cancer specialist if you are admitted to the hospital.  SEEK MEDICAL CARE IF:  Your diarrhea or constipation does not go away.   Your bowel habits change.  You have increased abdominal pain.   You notice new fatigue or weakness.  You lose weight. Document Released: 06/27/2005 Document Revised: 02/27/2013 Document Reviewed: 12/20/2012 [facility] Patient Information 2014 [place].

## 2013-10-01 ENCOUNTER — Other Ambulatory Visit (HOSPITAL_BASED_OUTPATIENT_CLINIC_OR_DEPARTMENT_OTHER): Payer: BC Managed Care – PPO

## 2013-10-01 ENCOUNTER — Ambulatory Visit (HOSPITAL_BASED_OUTPATIENT_CLINIC_OR_DEPARTMENT_OTHER): Payer: BC Managed Care – PPO | Admitting: Nurse Practitioner

## 2013-10-01 ENCOUNTER — Telehealth: Payer: Self-pay | Admitting: Oncology

## 2013-10-01 VITALS — BP 170/68 | HR 103 | Temp 98.2°F | Resp 18 | Ht 66.0 in | Wt 165.9 lb

## 2013-10-01 DIAGNOSIS — I10 Essential (primary) hypertension: Secondary | ICD-10-CM

## 2013-10-01 DIAGNOSIS — C187 Malignant neoplasm of sigmoid colon: Secondary | ICD-10-CM

## 2013-10-01 LAB — CBC WITH DIFFERENTIAL/PLATELET
BASO%: 0.5 % (ref 0.0–2.0)
Basophils Absolute: 0 10*3/uL (ref 0.0–0.1)
EOS%: 3.4 % (ref 0.0–7.0)
Eosinophils Absolute: 0.2 10*3/uL (ref 0.0–0.5)
HCT: 37.9 % (ref 34.8–46.6)
HGB: 12.4 g/dL (ref 11.6–15.9)
LYMPH%: 42.1 % (ref 14.0–49.7)
MCH: 30.5 pg (ref 25.1–34.0)
MCHC: 32.7 g/dL (ref 31.5–36.0)
MCV: 93.3 fL (ref 79.5–101.0)
MONO#: 0.7 10*3/uL (ref 0.1–0.9)
MONO%: 15 % — ABNORMAL HIGH (ref 0.0–14.0)
NEUT#: 1.8 10*3/uL (ref 1.5–6.5)
NEUT%: 39 % (ref 38.4–76.8)
Platelets: 259 10*3/uL (ref 145–400)
RBC: 4.06 10*6/uL (ref 3.70–5.45)
RDW: 14.8 % — ABNORMAL HIGH (ref 11.2–14.5)
WBC: 4.7 10*3/uL (ref 3.9–10.3)
lymph#: 2 10*3/uL (ref 0.9–3.3)

## 2013-10-01 LAB — COMPREHENSIVE METABOLIC PANEL (CC13)
ALT: 11 U/L (ref 0–55)
AST: 13 U/L (ref 5–34)
Albumin: 4 g/dL (ref 3.5–5.0)
Alkaline Phosphatase: 51 U/L (ref 40–150)
Anion Gap: 9 mEq/L (ref 3–11)
BUN: 14.8 mg/dL (ref 7.0–26.0)
CALCIUM: 9.7 mg/dL (ref 8.4–10.4)
CHLORIDE: 105 meq/L (ref 98–109)
CO2: 27 mEq/L (ref 22–29)
CREATININE: 1 mg/dL (ref 0.6–1.1)
Glucose: 136 mg/dl (ref 70–140)
POTASSIUM: 3.5 meq/L (ref 3.5–5.1)
SODIUM: 140 meq/L (ref 136–145)
Total Bilirubin: 0.34 mg/dL (ref 0.20–1.20)
Total Protein: 7 g/dL (ref 6.4–8.3)

## 2013-10-01 NOTE — Progress Notes (Signed)
OFFICE PROGRESS NOTE  Interval history:  Ms. Christine Shepherd returns for followup of colon cancer. She completed cycle 1 adjuvant Xeloda beginning 09/14/2013. She had mild nausea week 2. No vomiting. She denies any mouth sores. No diarrhea. No hand or foot pain or redness. She has noted some darkening of the skin on her hands. She had recent constipation. She noted some blood on the toilet tissue after straining for a bowel movement.    Objective: Filed Vitals:   10/01/13 1508  BP: 170/68  Pulse: 103  Temp: 98.2 F (36.8 C)  Resp: 18   No thrush or ulcerations. Lungs are clear. Regular cardiac rhythm. Abdomen soft and nontender. No hepatomegaly. No leg edema. Palms nontender and without erythema. Mild hyperpigmentation over the palms.   Lab Results: Lab Results  Component Value Date   WBC 4.7 10/01/2013   HGB 12.4 10/01/2013   HCT 37.9 10/01/2013   MCV 93.3 10/01/2013   PLT 259 10/01/2013   NEUTROABS 1.8 10/01/2013    Chemistry:    Chemistry      Component Value Date/Time   NA 140 10/01/2013 1454   NA 140 08/14/2013 0514   K 3.5 10/01/2013 1454   K 4.8 08/14/2013 0514   CL 106 08/14/2013 0514   CO2 27 10/01/2013 1454   CO2 25 08/14/2013 0514   BUN 14.8 10/01/2013 1454   BUN 8 08/14/2013 0514   CREATININE 1.0 10/01/2013 1454   CREATININE 0.82 08/14/2013 0514      Component Value Date/Time   CALCIUM 9.7 10/01/2013 1454   CALCIUM 8.4 08/14/2013 0514   ALKPHOS 51 10/01/2013 1454   ALKPHOS 42 07/02/2013 1518   AST 13 10/01/2013 1454   AST 16 07/02/2013 1518   ALT 11 10/01/2013 1454   ALT 14 07/02/2013 1518   BILITOT 0.34 10/01/2013 1454   BILITOT 0.7 07/02/2013 1518       Studies/Results: No results found.  Medications: I have reviewed the patient's current medications.  Assessment/Plan: 1. 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   Initiation of adjuvant Xeloda 09/14/2013.   2. History of multiple colon polyps. 3. Thyromegaly with multiple thyroid nodules-status post right and left thyroid nodule biopsies on 08/07/2013-benign. 4. Hypothyroidism. 5. Hypertension.    Dispositon-she appears stable. She has completed one cycle of adjuvant Xeloda. Plan to proceed with cycle 2 as scheduled beginning 10/05/2013. She will return for a followup visit in 3 weeks. She will contact the office in the interim with any problems.   Ned Card ANP/GNP-BC

## 2013-10-01 NOTE — Telephone Encounter (Signed)
gv pt appt schedule for april/may. per pt request lab appts seperated from f/u appts due to she needs the latest appts possible.

## 2013-10-08 ENCOUNTER — Other Ambulatory Visit: Payer: Self-pay | Admitting: Oncology

## 2013-10-08 ENCOUNTER — Telehealth: Payer: Self-pay | Admitting: *Deleted

## 2013-10-08 DIAGNOSIS — C187 Malignant neoplasm of sigmoid colon: Secondary | ICD-10-CM

## 2013-10-08 NOTE — Telephone Encounter (Addendum)
Message from pt reporting she is delayed on starting her next cycle of Xeloda. Called pharmacy and they do not have a refill on file. Returned call to pt, informed her that refill was sent today. Call pharmacy to arrange delivery. She voiced understanding, frustration for delay. Instructed pt to call office when she receives med, in case office visit/ lab needs to be rescheduled.

## 2013-10-11 ENCOUNTER — Telehealth: Payer: Self-pay | Admitting: Oncology

## 2013-10-11 ENCOUNTER — Telehealth: Payer: Self-pay | Admitting: *Deleted

## 2013-10-11 ENCOUNTER — Other Ambulatory Visit: Payer: Self-pay | Admitting: *Deleted

## 2013-10-11 NOTE — Telephone Encounter (Signed)
S/w the pt and she is aware of her April appts that have been r/s.

## 2013-10-11 NOTE — Telephone Encounter (Signed)
Just got her xeloda yesterday and needs to reschedule her follow up out a week. POF to scheduler. Called patient back and left VM that her appointment request was forwarded to scheduler.

## 2013-10-21 ENCOUNTER — Encounter: Payer: Self-pay | Admitting: Surgery

## 2013-10-21 ENCOUNTER — Other Ambulatory Visit: Payer: BC Managed Care – PPO

## 2013-10-22 ENCOUNTER — Ambulatory Visit: Payer: BC Managed Care – PPO | Admitting: Nurse Practitioner

## 2013-10-28 ENCOUNTER — Other Ambulatory Visit (HOSPITAL_BASED_OUTPATIENT_CLINIC_OR_DEPARTMENT_OTHER): Payer: BC Managed Care – PPO

## 2013-10-28 DIAGNOSIS — C187 Malignant neoplasm of sigmoid colon: Secondary | ICD-10-CM

## 2013-10-28 LAB — CBC WITH DIFFERENTIAL/PLATELET
BASO%: 0.5 % (ref 0.0–2.0)
BASOS ABS: 0 10*3/uL (ref 0.0–0.1)
EOS%: 2.8 % (ref 0.0–7.0)
Eosinophils Absolute: 0.1 10*3/uL (ref 0.0–0.5)
HCT: 37.9 % (ref 34.8–46.6)
HEMOGLOBIN: 12.5 g/dL (ref 11.6–15.9)
LYMPH%: 38.5 % (ref 14.0–49.7)
MCH: 31.3 pg (ref 25.1–34.0)
MCHC: 33.1 g/dL (ref 31.5–36.0)
MCV: 94.8 fL (ref 79.5–101.0)
MONO#: 0.5 10*3/uL (ref 0.1–0.9)
MONO%: 11.3 % (ref 0.0–14.0)
NEUT#: 2.2 10*3/uL (ref 1.5–6.5)
NEUT%: 46.9 % (ref 38.4–76.8)
Platelets: 254 10*3/uL (ref 145–400)
RBC: 3.99 10*6/uL (ref 3.70–5.45)
RDW: 17.6 % — AB (ref 11.2–14.5)
WBC: 4.7 10*3/uL (ref 3.9–10.3)
lymph#: 1.8 10*3/uL (ref 0.9–3.3)

## 2013-10-28 LAB — COMPREHENSIVE METABOLIC PANEL (CC13)
ALK PHOS: 58 U/L (ref 40–150)
ALT: 14 U/L (ref 0–55)
AST: 14 U/L (ref 5–34)
Albumin: 4 g/dL (ref 3.5–5.0)
Anion Gap: 10 mEq/L (ref 3–11)
BUN: 15.9 mg/dL (ref 7.0–26.0)
CO2: 26 mEq/L (ref 22–29)
CREATININE: 1.1 mg/dL (ref 0.6–1.1)
Calcium: 10 mg/dL (ref 8.4–10.4)
Chloride: 105 mEq/L (ref 98–109)
Glucose: 150 mg/dl — ABNORMAL HIGH (ref 70–140)
POTASSIUM: 3.4 meq/L — AB (ref 3.5–5.1)
Sodium: 141 mEq/L (ref 136–145)
Total Bilirubin: 0.27 mg/dL (ref 0.20–1.20)
Total Protein: 7 g/dL (ref 6.4–8.3)

## 2013-10-30 ENCOUNTER — Ambulatory Visit (HOSPITAL_BASED_OUTPATIENT_CLINIC_OR_DEPARTMENT_OTHER): Payer: BC Managed Care – PPO | Admitting: Nurse Practitioner

## 2013-10-30 ENCOUNTER — Telehealth: Payer: Self-pay | Admitting: Oncology

## 2013-10-30 VITALS — BP 169/80 | HR 103 | Temp 98.2°F | Resp 18 | Ht 66.0 in | Wt 169.0 lb

## 2013-10-30 DIAGNOSIS — C187 Malignant neoplasm of sigmoid colon: Secondary | ICD-10-CM

## 2013-10-30 DIAGNOSIS — I10 Essential (primary) hypertension: Secondary | ICD-10-CM

## 2013-10-30 DIAGNOSIS — Z86010 Personal history of colonic polyps: Secondary | ICD-10-CM

## 2013-10-30 DIAGNOSIS — E039 Hypothyroidism, unspecified: Secondary | ICD-10-CM

## 2013-10-30 DIAGNOSIS — E876 Hypokalemia: Secondary | ICD-10-CM

## 2013-10-30 MED ORDER — CAPECITABINE 500 MG PO TABS: ORAL_TABLET | ORAL | Status: DC

## 2013-10-30 MED ORDER — POTASSIUM CHLORIDE CRYS ER 20 MEQ PO TBCR: 20.0000 meq | EXTENDED_RELEASE_TABLET | Freq: Every day | ORAL | Status: DC

## 2013-10-30 MED ORDER — LORAZEPAM 0.5 MG PO TABS: ORAL_TABLET | ORAL | Status: DC

## 2013-10-30 NOTE — Telephone Encounter (Signed)
gv and printed appt sched and avs for pt for May °

## 2013-10-30 NOTE — Progress Notes (Signed)
  [place] OFFICE PROGRESS NOTE   Diagnosis:  Colon cancer.  INTERVAL HISTORY:   She returns as scheduled. She completed cycle 2 adjuvant Xeloda beginning 10/11/2013. She denies nausea/vomiting. No mouth sores. No diarrhea. No hand or foot pain or redness. She has noted mild anxiety intermittently and would like to try an antianxiety medication.  Objective:  Vital signs in last 24 hours:  Blood pressure 169/80, pulse 103, temperature 98.2 F (36.8 C), temperature source Oral, resp. rate 18, height 5\' 6"  (1.676 m), weight 169 lb (76.658 kg).    HEENT: No thrush or ulcerations. Resp: Lungs clear. Cardio: Regular cardiac rhythm. GI: Abdomen soft and nontender. No hepatomegaly. Vascular: No leg edema. Calves nontender.  Skin: Hands with hyperpigmentation.    Lab Results:  Lab Results  Component Value Date   WBC 4.7 10/28/2013   HGB 12.5 10/28/2013   HCT 37.9 10/28/2013   MCV 94.8 10/28/2013   PLT 254 10/28/2013   NEUTROABS 2.2 10/28/2013    Imaging:  No results found.  Medications: I have reviewed the patient's current medications.  Assessment/Plan: 1. 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  Initiation of adjuvant Xeloda 09/14/2013.  Cycle 2 adjuvant Xeloda 10/11/2013. 2. History of multiple colon polyps. 3. Thyromegaly with multiple thyroid nodules-status post right and left thyroid nodule biopsies on 08/07/2013-benign. 4. Hypothyroidism. 5. Hypertension.  6. Hypokalemia (3.4) 10/28/2013. She is on a diuretic. She will begin Kdur 20 mEq daily.   Disposition: She appears stable. She has completed 2 cycles of adjuvant Xeloda. Plan to proceed with cycle 3 as scheduled on 11/01/2013. She will return for a followup visit on 11/19/2013. She will contact the office in the interim with any problems.   Owens Shark ANP/GNP-BC   10/30/2013    4:19 PM

## 2013-11-11 ENCOUNTER — Other Ambulatory Visit: Payer: BC Managed Care – PPO

## 2013-11-12 ENCOUNTER — Ambulatory Visit: Payer: BC Managed Care – PPO | Admitting: Oncology

## 2013-11-19 ENCOUNTER — Ambulatory Visit (HOSPITAL_BASED_OUTPATIENT_CLINIC_OR_DEPARTMENT_OTHER): Payer: BC Managed Care – PPO | Admitting: Oncology

## 2013-11-19 ENCOUNTER — Other Ambulatory Visit (HOSPITAL_BASED_OUTPATIENT_CLINIC_OR_DEPARTMENT_OTHER): Payer: BC Managed Care – PPO

## 2013-11-19 ENCOUNTER — Telehealth: Payer: Self-pay | Admitting: Oncology

## 2013-11-19 VITALS — BP 151/73 | HR 98 | Temp 98.1°F | Resp 19 | Ht 66.0 in | Wt 169.6 lb

## 2013-11-19 DIAGNOSIS — E039 Hypothyroidism, unspecified: Secondary | ICD-10-CM

## 2013-11-19 DIAGNOSIS — I10 Essential (primary) hypertension: Secondary | ICD-10-CM

## 2013-11-19 DIAGNOSIS — C187 Malignant neoplasm of sigmoid colon: Secondary | ICD-10-CM

## 2013-11-19 LAB — COMPREHENSIVE METABOLIC PANEL (CC13)
ALBUMIN: 4.1 g/dL (ref 3.5–5.0)
ALT: 16 U/L (ref 0–55)
AST: 14 U/L (ref 5–34)
Alkaline Phosphatase: 62 U/L (ref 40–150)
Anion Gap: 11 mEq/L (ref 3–11)
BUN: 16.8 mg/dL (ref 7.0–26.0)
CO2: 24 mEq/L (ref 22–29)
Calcium: 9.9 mg/dL (ref 8.4–10.4)
Chloride: 106 mEq/L (ref 98–109)
Creatinine: 1.1 mg/dL (ref 0.6–1.1)
GLUCOSE: 109 mg/dL (ref 70–140)
POTASSIUM: 3.7 meq/L (ref 3.5–5.1)
SODIUM: 141 meq/L (ref 136–145)
TOTAL PROTEIN: 7.3 g/dL (ref 6.4–8.3)
Total Bilirubin: 0.57 mg/dL (ref 0.20–1.20)

## 2013-11-19 LAB — CBC WITH DIFFERENTIAL/PLATELET
BASO%: 0.3 % (ref 0.0–2.0)
BASOS ABS: 0 10*3/uL (ref 0.0–0.1)
EOS ABS: 0.1 10*3/uL (ref 0.0–0.5)
EOS%: 2.3 % (ref 0.0–7.0)
HCT: 39.1 % (ref 34.8–46.6)
HEMOGLOBIN: 12.9 g/dL (ref 11.6–15.9)
LYMPH%: 37.8 % (ref 14.0–49.7)
MCH: 31.8 pg (ref 25.1–34.0)
MCHC: 33.1 g/dL (ref 31.5–36.0)
MCV: 96.1 fL (ref 79.5–101.0)
MONO#: 0.5 10*3/uL (ref 0.1–0.9)
MONO%: 11 % (ref 0.0–14.0)
NEUT%: 48.6 % (ref 38.4–76.8)
NEUTROS ABS: 2.3 10*3/uL (ref 1.5–6.5)
PLATELETS: 218 10*3/uL (ref 145–400)
RBC: 4.07 10*6/uL (ref 3.70–5.45)
RDW: 18.7 % — ABNORMAL HIGH (ref 11.2–14.5)
WBC: 4.8 10*3/uL (ref 3.9–10.3)
lymph#: 1.8 10*3/uL (ref 0.9–3.3)

## 2013-11-19 NOTE — Progress Notes (Signed)
  [place] OFFICE PROGRESS NOTE   Diagnosis: Colon cancer  INTERVAL HISTORY:   Christine Shepherd returns as scheduled. She completed another cycle of Xeloda beginning on the evening of 11/02/2013. She completed the Xeloda 11/17/2013. No mouth sores, diarrhea, or hand/foot pain. Minimal nausea. She has developed hyperpigmentation of the hands and feet.  Objective:  Vital signs in last 24 hours:  Blood pressure 151/73, pulse 98, temperature 98.1 F (36.7 C), temperature source Oral, resp. rate 19, height 5\' 6"  (1.676 m), weight 169 lb 9.6 oz (76.93 kg).    HEENT: No thrush or ulcers Resp: Lungs clear bilaterally Cardio: Regular rate and rhythm GI: No hepatomegaly, nontender Vascular: No leg edema  Skin: Hyperpigmentation of the hands and feet without erythema or skin breakdown   Portacath/PICC-without erythema  Lab Results:  Lab Results  Component Value Date   WBC 4.8 11/19/2013   HGB 12.9 11/19/2013   HCT 39.1 11/19/2013   MCV 96.1 11/19/2013   PLT 218 11/19/2013   NEUTROABS 2.3 11/19/2013     Medications: I have reviewed the patient's current medications.  Assessment/Plan: 1. 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  Initiation of adjuvant Xeloda 09/14/2013.  2. History of multiple colon polyps. 3. Thyromegaly with multiple thyroid nodules-status post right and left thyroid nodule biopsies on 08/07/2013-benign. 4. Hypothyroidism. 5. Hypertension.    Disposition:  [name] has completed 3 cycles of adjuvant Xeloda. She continues to tolerate the Xeloda well. She will begin cycle 4 on 11/24/2013. [name] will return for an office and lab visit 12/10/2013. She will contact us for hand/foot pain.  [name], MD  11/19/2013  3:27 PM

## 2013-11-19 NOTE — Telephone Encounter (Signed)
gv and printed appt sched and avs for pt for June... °

## 2013-11-20 ENCOUNTER — Other Ambulatory Visit: Payer: Self-pay | Admitting: Oncology

## 2013-12-10 ENCOUNTER — Ambulatory Visit (HOSPITAL_BASED_OUTPATIENT_CLINIC_OR_DEPARTMENT_OTHER): Payer: BC Managed Care – PPO | Admitting: Oncology

## 2013-12-10 ENCOUNTER — Other Ambulatory Visit (HOSPITAL_BASED_OUTPATIENT_CLINIC_OR_DEPARTMENT_OTHER): Payer: BC Managed Care – PPO

## 2013-12-10 ENCOUNTER — Telehealth: Payer: Self-pay | Admitting: Oncology

## 2013-12-10 VITALS — BP 161/77 | HR 95 | Temp 97.6°F | Resp 18 | Ht 66.0 in | Wt 169.9 lb

## 2013-12-10 DIAGNOSIS — C187 Malignant neoplasm of sigmoid colon: Secondary | ICD-10-CM

## 2013-12-10 DIAGNOSIS — L270 Generalized skin eruption due to drugs and medicaments taken internally: Secondary | ICD-10-CM

## 2013-12-10 LAB — CBC WITH DIFFERENTIAL/PLATELET
BASO%: 0.5 % (ref 0.0–2.0)
Basophils Absolute: 0 10*3/uL (ref 0.0–0.1)
EOS%: 3.1 % (ref 0.0–7.0)
Eosinophils Absolute: 0.1 10*3/uL (ref 0.0–0.5)
HCT: 36.9 % (ref 34.8–46.6)
HGB: 12.7 g/dL (ref 11.6–15.9)
LYMPH%: 49.4 % (ref 14.0–49.7)
MCH: 32.8 pg (ref 25.1–34.0)
MCHC: 34.4 g/dL (ref 31.5–36.0)
MCV: 95.3 fL (ref 79.5–101.0)
MONO#: 0.5 10*3/uL (ref 0.1–0.9)
MONO%: 11.4 % (ref 0.0–14.0)
NEUT#: 1.5 10*3/uL (ref 1.5–6.5)
NEUT%: 35.6 % — AB (ref 38.4–76.8)
Platelets: 184 10*3/uL (ref 145–400)
RBC: 3.87 10*6/uL (ref 3.70–5.45)
RDW: 18.8 % — ABNORMAL HIGH (ref 11.2–14.5)
WBC: 4.1 10*3/uL (ref 3.9–10.3)
lymph#: 2 10*3/uL (ref 0.9–3.3)

## 2013-12-10 NOTE — Progress Notes (Signed)
  [place] OFFICE PROGRESS NOTE   Diagnosis: Colon cancer  INTERVAL HISTORY:   She returns as scheduled. She completed another cycle of Xeloda on 12/07/2013. No mouth sores or diarrhea. Mild nausea. Mild discomfort at the soles. She continues working.  Objective:  Vital signs in last 24 hours:  Blood pressure 161/77, pulse 95, temperature 97.6 F (36.4 C), temperature source Oral, resp. rate 18, height 5\' 6"  (1.676 m), weight 169 lb 14.4 oz (77.066 kg), SpO2 100.00%.    HEENT: No thrush or ulcers Resp: Lungs clear bilaterally Cardio: Regular rate and rhythm GI: No hepatomegaly, nontender Vascular: No leg edema  Skin: Superficial desquamation and hyperpigmentation over the soles. Hyperpigmentation over the hands. No erythema.     Lab Results:  Lab Results  Component Value Date   WBC 4.1 12/10/2013   HGB 12.7 12/10/2013   HCT 36.9 12/10/2013   MCV 95.3 12/10/2013   PLT 184 12/10/2013   NEUTROABS 1.5 12/10/2013    Medications: I have reviewed the patient's current medications.  Assessment/Plan: 1. 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  Initiation of adjuvant Xeloda 09/14/2013.  2. History of multiple colon polyps. 3. Thyromegaly with multiple thyroid nodules-status post right and left thyroid nodule biopsies on 08/07/2013-benign. 4. Hypothyroidism. 5. Hypertension.  6. 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 She continues to tolerate Xeloda well. The plan is to proceed with cycle 5 on 12/15/2013. She has developed mild hand-foot syndrome. She will discontinue the Xeloda and contact us for increased foot discomfort.  Ms. Levay will return for an office visit 01/02/2014.  [name], MD  12/10/2013  4:04 PM

## 2013-12-10 NOTE — Progress Notes (Signed)
Met with patient to assess for needs.  She continues to work ,while on Xeloda, without difficulty.  She understands to report worsening symptoms of hand-foot syndrome.  She denies need for additional services at this time.  She expressed appreciation for the visit.

## 2013-12-10 NOTE — Telephone Encounter (Signed)
[facility] ADN PRINTED APPTS CHED AND AVS FOR PT FOR jUNE. °

## 2013-12-24 ENCOUNTER — Other Ambulatory Visit: Payer: Self-pay | Admitting: Nurse Practitioner

## 2014-01-02 ENCOUNTER — Ambulatory Visit (HOSPITAL_BASED_OUTPATIENT_CLINIC_OR_DEPARTMENT_OTHER): Payer: BC Managed Care – PPO | Admitting: Nurse Practitioner

## 2014-01-02 ENCOUNTER — Other Ambulatory Visit (HOSPITAL_BASED_OUTPATIENT_CLINIC_OR_DEPARTMENT_OTHER): Payer: BC Managed Care – PPO

## 2014-01-02 ENCOUNTER — Telehealth: Payer: Self-pay | Admitting: Oncology

## 2014-01-02 ENCOUNTER — Telehealth: Payer: Self-pay | Admitting: *Deleted

## 2014-01-02 VITALS — BP 173/79 | HR 78 | Temp 98.4°F | Resp 18 | Ht 66.0 in | Wt 171.6 lb

## 2014-01-02 DIAGNOSIS — C187 Malignant neoplasm of sigmoid colon: Secondary | ICD-10-CM

## 2014-01-02 LAB — CBC WITH DIFFERENTIAL/PLATELET
BASO%: 0.5 % (ref 0.0–2.0)
Basophils Absolute: 0 10*3/uL (ref 0.0–0.1)
EOS%: 4.4 % (ref 0.0–7.0)
Eosinophils Absolute: 0.2 10*3/uL (ref 0.0–0.5)
HCT: 38.1 % (ref 34.8–46.6)
HGB: 12.7 g/dL (ref 11.6–15.9)
LYMPH#: 1.8 10*3/uL (ref 0.9–3.3)
LYMPH%: 42.6 % (ref 14.0–49.7)
MCH: 34.2 pg — ABNORMAL HIGH (ref 25.1–34.0)
MCHC: 33.3 g/dL (ref 31.5–36.0)
MCV: 102.7 fL — ABNORMAL HIGH (ref 79.5–101.0)
MONO#: 0.6 10*3/uL (ref 0.1–0.9)
MONO%: 13.2 % (ref 0.0–14.0)
NEUT#: 1.7 10*3/uL (ref 1.5–6.5)
NEUT%: 39.3 % (ref 38.4–76.8)
Platelets: 192 10*3/uL (ref 145–400)
RBC: 3.71 10*6/uL (ref 3.70–5.45)
RDW: 22.6 % — ABNORMAL HIGH (ref 11.2–14.5)
WBC: 4.2 10*3/uL (ref 3.9–10.3)

## 2014-01-02 LAB — COMPREHENSIVE METABOLIC PANEL (CC13)
ALT: 10 U/L (ref 0–55)
AST: 12 U/L (ref 5–34)
Albumin: 3.9 g/dL (ref 3.5–5.0)
Alkaline Phosphatase: 52 U/L (ref 40–150)
Anion Gap: 8 mEq/L (ref 3–11)
BILIRUBIN TOTAL: 0.7 mg/dL (ref 0.20–1.20)
BUN: 17.6 mg/dL (ref 7.0–26.0)
CO2: 24 meq/L (ref 22–29)
Calcium: 9.5 mg/dL (ref 8.4–10.4)
Chloride: 109 mEq/L (ref 98–109)
Creatinine: 1 mg/dL (ref 0.6–1.1)
Glucose: 115 mg/dl (ref 70–140)
Potassium: 3.8 mEq/L (ref 3.5–5.1)
SODIUM: 142 meq/L (ref 136–145)
Total Protein: 6.9 g/dL (ref 6.4–8.3)

## 2014-01-02 MED ORDER — CAPECITABINE 500 MG PO TABS: ORAL_TABLET | ORAL | Status: DC

## 2014-01-02 NOTE — Telephone Encounter (Signed)
[place] [phone].

## 2014-01-02 NOTE — Telephone Encounter (Signed)
Gave pt apt for lab and Md for july

## 2014-01-02 NOTE — Progress Notes (Signed)
  [place] OFFICE PROGRESS NOTE   Diagnosis:  Colon cancer.  INTERVAL HISTORY:   Christine Shepherd returns as scheduled. She completed cycle 5 Xeloda beginning 12/15/2013. She has occasional mild nausea. No vomiting. She denies mouth sores. No diarrhea. She has occasional constipation. No hand or foot pain or redness. She notes intermittent swelling of the feet.  Objective:  Vital signs in last 24 hours:  Blood pressure 173/79, pulse 78, temperature 98.4 F (36.9 C), temperature source Oral, resp. rate 18, height 5\' 6"  (1.676 m), weight 171 lb 9.6 oz (77.837 kg), SpO2 100.00%.    HEENT: No thrush or ulcerations. Resp: Lungs clear. Cardio: Regular cardiac rhythm. GI: Abdomen soft and nontender. No hepatomegaly. Vascular: No leg edema. Skin: Palms and soles with skin thickening and hyperpigmentation. No skin breakdown.    Lab Results:  Lab Results  Component Value Date   WBC 4.2 01/02/2014   HGB 12.7 01/02/2014   HCT 38.1 01/02/2014   MCV 102.7* 01/02/2014   PLT 192 01/02/2014   NEUTROABS 1.7 01/02/2014    Imaging:  No results found.  Medications: I have reviewed the patient's current medications.  Assessment/Plan: 1. 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  Initiation of adjuvant Xeloda 09/14/2013.  2. History of multiple colon polyps. 3. Thyromegaly with multiple thyroid nodules-status post right and left thyroid nodule biopsies on 08/07/2013-benign. 4. Hypothyroidism. 5. Hypertension.  6. Hand-foot syndrome secondary to [facility].   Disposition: Christine Shepherd appears stable. She has completed 5 cycles of adjuvant Xeloda. Plan to proceed with cycle 6 as scheduled beginning 01/05/2014. She will return for a followup visit in 3 weeks. She will contact the office in the interim with any problems. We specifically discussed mouth sores, diarrhea and  hand/foot symptoms.    Ned Card ANP/GNP-BC   01/02/2014  10:27 AM

## 2014-01-23 ENCOUNTER — Ambulatory Visit (HOSPITAL_BASED_OUTPATIENT_CLINIC_OR_DEPARTMENT_OTHER): Payer: BC Managed Care – PPO | Admitting: Nurse Practitioner

## 2014-01-23 ENCOUNTER — Telehealth: Payer: Self-pay | Admitting: Oncology

## 2014-01-23 ENCOUNTER — Other Ambulatory Visit (HOSPITAL_BASED_OUTPATIENT_CLINIC_OR_DEPARTMENT_OTHER): Payer: BC Managed Care – PPO

## 2014-01-23 VITALS — BP 155/84 | HR 77 | Temp 98.4°F | Resp 20 | Ht 66.0 in | Wt 172.3 lb

## 2014-01-23 DIAGNOSIS — I10 Essential (primary) hypertension: Secondary | ICD-10-CM

## 2014-01-23 DIAGNOSIS — C187 Malignant neoplasm of sigmoid colon: Secondary | ICD-10-CM

## 2014-01-23 DIAGNOSIS — L270 Generalized skin eruption due to drugs and medicaments taken internally: Secondary | ICD-10-CM

## 2014-01-23 DIAGNOSIS — E049 Nontoxic goiter, unspecified: Secondary | ICD-10-CM

## 2014-01-23 DIAGNOSIS — E039 Hypothyroidism, unspecified: Secondary | ICD-10-CM

## 2014-01-23 DIAGNOSIS — L03039 Cellulitis of unspecified toe: Secondary | ICD-10-CM

## 2014-01-23 LAB — CBC WITH DIFFERENTIAL/PLATELET
BASO%: 0.7 % (ref 0.0–2.0)
Basophils Absolute: 0 10*3/uL (ref 0.0–0.1)
EOS%: 4.3 % (ref 0.0–7.0)
Eosinophils Absolute: 0.2 10*3/uL (ref 0.0–0.5)
HEMATOCRIT: 38.1 % (ref 34.8–46.6)
HGB: 12.7 g/dL (ref 11.6–15.9)
LYMPH%: 43.8 % (ref 14.0–49.7)
MCH: 34.4 pg — ABNORMAL HIGH (ref 25.1–34.0)
MCHC: 33.2 g/dL (ref 31.5–36.0)
MCV: 103.6 fL — ABNORMAL HIGH (ref 79.5–101.0)
MONO#: 0.6 10*3/uL (ref 0.1–0.9)
MONO%: 13.8 % (ref 0.0–14.0)
NEUT#: 1.6 10*3/uL (ref 1.5–6.5)
NEUT%: 37.4 % — AB (ref 38.4–76.8)
Platelets: 203 10*3/uL (ref 145–400)
RBC: 3.67 10*6/uL — ABNORMAL LOW (ref 3.70–5.45)
RDW: 21.7 % — ABNORMAL HIGH (ref 11.2–14.5)
WBC: 4.3 10*3/uL (ref 3.9–10.3)
lymph#: 1.9 10*3/uL (ref 0.9–3.3)

## 2014-01-23 LAB — COMPREHENSIVE METABOLIC PANEL (CC13)
ALT: 14 U/L (ref 0–55)
AST: 15 U/L (ref 5–34)
Albumin: 4.1 g/dL (ref 3.5–5.0)
Alkaline Phosphatase: 58 U/L (ref 40–150)
Anion Gap: 8 mEq/L (ref 3–11)
BILIRUBIN TOTAL: 0.86 mg/dL (ref 0.20–1.20)
BUN: 15.1 mg/dL (ref 7.0–26.0)
CALCIUM: 9.8 mg/dL (ref 8.4–10.4)
CHLORIDE: 106 meq/L (ref 98–109)
CO2: 25 mEq/L (ref 22–29)
CREATININE: 1 mg/dL (ref 0.6–1.1)
Glucose: 96 mg/dl (ref 70–140)
Potassium: 3.7 mEq/L (ref 3.5–5.1)
Sodium: 139 mEq/L (ref 136–145)
Total Protein: 7.3 g/dL (ref 6.4–8.3)

## 2014-01-23 NOTE — Telephone Encounter (Signed)
gv and printed appt sched and avs for pt fro Aug °

## 2014-01-23 NOTE — Progress Notes (Signed)
  [place] OFFICE PROGRESS NOTE   Diagnosis:  Colon cancer.  INTERVAL HISTORY:   Ms. Harney returns as scheduled. She completed cycle 6 adjuvant Xeloda beginning 01/05/2014. She has mild intermittent nausea. No vomiting. No mouth sores. No diarrhea. No hand or foot pain or redness. She notes less peeling over the feet. The nail on the left fourth toe "came off". She notes that the right great toenail is loose. No associated pain or redness. No drainage.  Objective:  Vital signs in last 24 hours:  Blood pressure 155/84, pulse 77, temperature 98.4 F (36.9 C), temperature source Oral, resp. rate 20, height 5\' 6"  (1.676 m), weight 172 lb 4.8 oz (78.155 kg).    HEENT: No thrush or ulcerations. Resp: Lungs clear. Cardio: Regular cardiac rhythm. GI: Abdomen soft and nontender. No hepatomegaly. Bowel sounds active. Vascular: No leg edema.  Skin: Hyperpigmentation of the palms and soles. Palms without erythema. Soles with mild erythema. No skin breakdown. The right great toe nail is loose. No redness or drainage.   Lab Results:  Lab Results  Component Value Date   WBC 4.3 01/23/2014   HGB 12.7 01/23/2014   HCT 38.1 01/23/2014   MCV 103.6* 01/23/2014   PLT 203 01/23/2014   NEUTROABS 1.6 01/23/2014    Imaging:  No results found.  Medications: I have reviewed the patient's current medications.  Assessment/Plan: 1. 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  Initiation of adjuvant Xeloda 09/14/2013.  2. History of multiple colon polyps. 3. Thyromegaly with multiple thyroid nodules-status post right and left thyroid nodule biopsies on 08/07/2013-benign. 4. Hypothyroidism. 5. Hypertension.  6. 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   Disposition: Christine Shepherd appears stable. She has now completed 6 cycles of adjuvant Xeloda. She is tolerating treatment  well. Plan to proceed with cycle 7 as scheduled on 01/26/2014.   The toenail changes may be related to Xeloda. She understands to contact the office if she develops any signs of infection.  She will return for a followup visit in 3 weeks.    Ned Card ANP/GNP-BC   01/23/2014  2:26 PM

## 2014-02-13 ENCOUNTER — Telehealth: Payer: Self-pay | Admitting: Oncology

## 2014-02-13 ENCOUNTER — Ambulatory Visit (HOSPITAL_BASED_OUTPATIENT_CLINIC_OR_DEPARTMENT_OTHER): Payer: BC Managed Care – PPO | Admitting: Nurse Practitioner

## 2014-02-13 ENCOUNTER — Other Ambulatory Visit: Payer: Self-pay | Admitting: *Deleted

## 2014-02-13 ENCOUNTER — Other Ambulatory Visit (HOSPITAL_BASED_OUTPATIENT_CLINIC_OR_DEPARTMENT_OTHER): Payer: BC Managed Care – PPO

## 2014-02-13 VITALS — BP 154/76 | HR 76 | Temp 98.7°F | Resp 20 | Ht 66.0 in | Wt 172.3 lb

## 2014-02-13 DIAGNOSIS — E039 Hypothyroidism, unspecified: Secondary | ICD-10-CM

## 2014-02-13 DIAGNOSIS — L270 Generalized skin eruption due to drugs and medicaments taken internally: Secondary | ICD-10-CM

## 2014-02-13 DIAGNOSIS — C187 Malignant neoplasm of sigmoid colon: Secondary | ICD-10-CM

## 2014-02-13 DIAGNOSIS — I10 Essential (primary) hypertension: Secondary | ICD-10-CM

## 2014-02-13 LAB — COMPREHENSIVE METABOLIC PANEL (CC13)
ALK PHOS: 59 U/L (ref 40–150)
ALT: 11 U/L (ref 0–55)
AST: 14 U/L (ref 5–34)
Albumin: 4 g/dL (ref 3.5–5.0)
Anion Gap: 8 mEq/L (ref 3–11)
BUN: 18.7 mg/dL (ref 7.0–26.0)
CALCIUM: 9.6 mg/dL (ref 8.4–10.4)
CO2: 26 mEq/L (ref 22–29)
CREATININE: 0.9 mg/dL (ref 0.6–1.1)
Chloride: 107 mEq/L (ref 98–109)
Glucose: 110 mg/dl (ref 70–140)
Potassium: 3.6 mEq/L (ref 3.5–5.1)
Sodium: 141 mEq/L (ref 136–145)
Total Bilirubin: 0.98 mg/dL (ref 0.20–1.20)
Total Protein: 7.1 g/dL (ref 6.4–8.3)

## 2014-02-13 LAB — CBC WITH DIFFERENTIAL/PLATELET
BASO%: 0.4 % (ref 0.0–2.0)
BASOS ABS: 0 10*3/uL (ref 0.0–0.1)
EOS%: 3 % (ref 0.0–7.0)
Eosinophils Absolute: 0.1 10*3/uL (ref 0.0–0.5)
HEMATOCRIT: 36.8 % (ref 34.8–46.6)
HGB: 12.3 g/dL (ref 11.6–15.9)
LYMPH%: 42.5 % (ref 14.0–49.7)
MCH: 35.9 pg — AB (ref 25.1–34.0)
MCHC: 33.4 g/dL (ref 31.5–36.0)
MCV: 107.4 fL — AB (ref 79.5–101.0)
MONO#: 0.5 10*3/uL (ref 0.1–0.9)
MONO%: 12.3 % (ref 0.0–14.0)
NEUT#: 1.7 10*3/uL (ref 1.5–6.5)
NEUT%: 41.8 % (ref 38.4–76.8)
Platelets: 224 10*3/uL (ref 145–400)
RBC: 3.43 10*6/uL — ABNORMAL LOW (ref 3.70–5.45)
RDW: 20.8 % — ABNORMAL HIGH (ref 11.2–14.5)
WBC: 4 10*3/uL (ref 3.9–10.3)
lymph#: 1.7 10*3/uL (ref 0.9–3.3)

## 2014-02-13 MED ORDER — CAPECITABINE 500 MG PO TABS: ORAL_TABLET | ORAL | Status: DC

## 2014-02-13 NOTE — Telephone Encounter (Signed)
gv adn printed appt sched adn avs for pt fro Sept..Marland KitchenMarland Kitchen

## 2014-02-13 NOTE — Telephone Encounter (Signed)
Faxed Xeloda prescription to [facility] [phone]

## 2014-02-13 NOTE — Progress Notes (Signed)
  [place] OFFICE PROGRESS NOTE   Diagnosis:  Colon cancer.  INTERVAL HISTORY:   Christine Shepherd returns as scheduled. She completed cycle 7  adjuvant Xeloda beginning 01/26/2014. She has occasional mild nausea. No vomiting. No mouth sores. No diarrhea. She denies hand or foot redness. She continues to have toe pain. The right great toenails are loose.  Objective:  Vital signs in last 24 hours:  Blood pressure 154/76, pulse 76, temperature 98.7 F (37.1 C), temperature source Oral, resp. rate 20, height 5\' 6"  (1.676 m), weight 172 lb 4.8 oz (78.155 kg), SpO2 100.00%.    HEENT: No thrush or ulcerations. Resp: Lungs clear. Cardio: Regular rate and rhythm. GI: Abdomen soft and nontender. No hepatomegaly. Vascular: No leg edema.  Skin: Palms and soles with hyperpigmentation and skin thickening. No skin breakdown. Both great toenails are discolored and loose.   Lab Results:  Lab Results  Component Value Date   WBC 4.0 02/13/2014   HGB 12.3 02/13/2014   HCT 36.8 02/13/2014   MCV 107.4* 02/13/2014   PLT 224 02/13/2014   NEUTROABS 1.7 02/13/2014    Imaging:  No results found.  Medications: I have reviewed the patient's current medications.  Assessment/Plan: 1. 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  Initiation of adjuvant Xeloda 09/14/2013.  2. History of multiple colon polyps. 3. Thyromegaly with multiple thyroid nodules-status post right and left thyroid nodule biopsies on 08/07/2013-benign. 4. Hypothyroidism. 5. Hypertension.  6. 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   Disposition: Christine Shepherd appears stable. She has completed 7 cycles of adjuvant Xeloda. Plan to proceed with the eighth and final cycle as scheduled beginning 02/16/2014. She will return for a followup visit in approximately 6 weeks. She will contact the office in the interim with any  problems. We specifically discussed any signs of infection involving the toenails.  Plan reviewed with Dr. Benay Spice.    Ned Card ANP/GNP-BC   02/13/2014  2:17 PM

## 2014-02-14 LAB — CEA: CEA: 1 ng/mL (ref 0.0–5.0)

## 2014-03-11 ENCOUNTER — Other Ambulatory Visit: Payer: Self-pay | Admitting: Oncology

## 2014-03-11 DIAGNOSIS — C187 Malignant neoplasm of sigmoid colon: Secondary | ICD-10-CM

## 2014-03-24 ENCOUNTER — Other Ambulatory Visit: Payer: Self-pay | Admitting: Oncology

## 2014-03-24 DIAGNOSIS — C187 Malignant neoplasm of sigmoid colon: Secondary | ICD-10-CM

## 2014-03-28 ENCOUNTER — Ambulatory Visit (HOSPITAL_BASED_OUTPATIENT_CLINIC_OR_DEPARTMENT_OTHER): Payer: BC Managed Care – PPO | Admitting: Oncology

## 2014-03-28 ENCOUNTER — Ambulatory Visit: Payer: BC Managed Care – PPO | Admitting: Oncology

## 2014-03-28 ENCOUNTER — Other Ambulatory Visit: Payer: BC Managed Care – PPO

## 2014-03-28 ENCOUNTER — Telehealth: Payer: Self-pay | Admitting: Oncology

## 2014-03-28 ENCOUNTER — Ambulatory Visit (HOSPITAL_BASED_OUTPATIENT_CLINIC_OR_DEPARTMENT_OTHER): Payer: BC Managed Care – PPO

## 2014-03-28 VITALS — BP 171/77 | HR 72 | Temp 98.4°F | Resp 18 | Ht 66.0 in | Wt 173.9 lb

## 2014-03-28 DIAGNOSIS — L270 Generalized skin eruption due to drugs and medicaments taken internally: Secondary | ICD-10-CM

## 2014-03-28 DIAGNOSIS — E039 Hypothyroidism, unspecified: Secondary | ICD-10-CM

## 2014-03-28 DIAGNOSIS — I10 Essential (primary) hypertension: Secondary | ICD-10-CM

## 2014-03-28 DIAGNOSIS — C187 Malignant neoplasm of sigmoid colon: Secondary | ICD-10-CM

## 2014-03-28 LAB — CBC WITH DIFFERENTIAL/PLATELET
BASO%: 0.7 % (ref 0.0–2.0)
Basophils Absolute: 0 10*3/uL (ref 0.0–0.1)
EOS%: 3.4 % (ref 0.0–7.0)
Eosinophils Absolute: 0.1 10*3/uL (ref 0.0–0.5)
HEMATOCRIT: 40.5 % (ref 34.8–46.6)
HGB: 13.3 g/dL (ref 11.6–15.9)
LYMPH#: 1.5 10*3/uL (ref 0.9–3.3)
LYMPH%: 39.8 % (ref 14.0–49.7)
MCH: 35.1 pg — ABNORMAL HIGH (ref 25.1–34.0)
MCHC: 32.8 g/dL (ref 31.5–36.0)
MCV: 106.8 fL — ABNORMAL HIGH (ref 79.5–101.0)
MONO#: 0.5 10*3/uL (ref 0.1–0.9)
MONO%: 12.9 % (ref 0.0–14.0)
NEUT#: 1.6 10*3/uL (ref 1.5–6.5)
NEUT%: 43.2 % (ref 38.4–76.8)
Platelets: 149 10*3/uL (ref 145–400)
RBC: 3.8 10*6/uL (ref 3.70–5.45)
RDW: 16 % — ABNORMAL HIGH (ref 11.2–14.5)
WBC: 3.7 10*3/uL — AB (ref 3.9–10.3)

## 2014-03-28 NOTE — Telephone Encounter (Signed)
Pt confirmed labs/ov per 09/18 POF, gave pt AVS and barium drink .[name]..KJ

## 2014-03-28 NOTE — Progress Notes (Signed)
  [place] OFFICE PROGRESS NOTE   Diagnosis: Colon cancer  INTERVAL HISTORY:   She returns as scheduled. She completed a final cycle of Xeloda 03/01/2014. No nausea, mouth sores, or diarrhea. The hand and foot symptoms are improving. She lost the right great toenail. She feels well.  Objective:  Vital signs in last 24 hours:  Blood pressure 171/77, pulse 72, temperature 98.4 F (36.9 C), temperature source Oral, resp. rate 18, height $RemoveBe'5\' 6"'pOfJeWoaQ$  (1.676 m), weight 173 lb 14.4 oz (78.881 kg).    HEENT: No thrush or ulcers, right thyroid mass Lymphatics: No cervical, supra-clavicular, axillary, or inguinal nodes Resp: Lungs clear bilaterally Cardio: Regular rate and rhythm GI: No hepatosplenomegaly, nontender, no mass Vascular: No leg edema  Skin: Hyperpigmentation with mild dryness and skin thickening over the palms and soles. No skin breakdown. The right great toenail appears to be re growing     Lab Results:  Lab Results  Component Value Date   WBC 3.7* 03/28/2014   HGB 13.3 03/28/2014   HCT 40.5 03/28/2014   MCV 106.8* 03/28/2014   PLT 149 03/28/2014   NEUTROABS 1.6 03/28/2014    Lab Results  Component Value Date   NA 141 02/13/2014    Lab Results  Component Value Date   CEA 1.0 02/13/2014    Imaging:  No results found.  Medications: I have reviewed the patient's current medications.  Assessment/Plan: 1. 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   No loss of mismatch repair protein expression Initiation of adjuvant Xeloda 09/14/2013. Cycle 8 completed beginning 02/16/2014 2. History of multiple colon polyps. 3. Thyromegaly with multiple thyroid nodules-status post right and left thyroid nodule biopsies on 08/07/2013-benign. 4. Hypothyroidism. 5. Hypertension.  6. Hand-foot syndrome secondary to Xeloda-improved   Disposition:  She has  completed the course of adjuvant Xeloda. Ms. Justus will be scheduled for a CEA and restaging CT evaluation in January. She has been scheduled for a one-year surveillance colonoscopy.  Betsy Coder, MD  03/28/2014  11:09 AM

## 2014-04-15 ENCOUNTER — Other Ambulatory Visit: Payer: Self-pay

## 2014-04-15 DIAGNOSIS — Z1231 Encounter for screening mammogram for malignant neoplasm of breast: Secondary | ICD-10-CM

## 2014-05-02 ENCOUNTER — Ambulatory Visit
Admission: RE | Admit: 2014-05-02 | Discharge: 2014-05-02 | Disposition: A | Discharge status: Home / Self Care | Payer: BC Managed Care – PPO | Source: Ambulatory Visit

## 2014-05-02 DIAGNOSIS — Z1231 Encounter for screening mammogram for malignant neoplasm of breast: Secondary | ICD-10-CM

## 2014-05-19 ENCOUNTER — Other Ambulatory Visit: Payer: Self-pay | Admitting: Internal Medicine

## 2014-05-19 DIAGNOSIS — E042 Nontoxic multinodular goiter: Secondary | ICD-10-CM

## 2014-07-21 ENCOUNTER — Ambulatory Visit (HOSPITAL_COMMUNITY)
Admission: RE | Admit: 2014-07-21 | Discharge: 2014-07-21 | Disposition: A | Discharge status: Home / Self Care | Payer: BLUE CROSS/BLUE SHIELD | Source: Ambulatory Visit | Attending: Oncology | Admitting: Oncology

## 2014-07-21 ENCOUNTER — Other Ambulatory Visit (HOSPITAL_BASED_OUTPATIENT_CLINIC_OR_DEPARTMENT_OTHER): Payer: BLUE CROSS/BLUE SHIELD

## 2014-07-21 ENCOUNTER — Encounter (HOSPITAL_COMMUNITY): Payer: Self-pay

## 2014-07-21 DIAGNOSIS — E049 Nontoxic goiter, unspecified: Secondary | ICD-10-CM | POA: Insufficient documentation

## 2014-07-21 DIAGNOSIS — K439 Ventral hernia without obstruction or gangrene: Secondary | ICD-10-CM | POA: Insufficient documentation

## 2014-07-21 DIAGNOSIS — Z79899 Other long term (current) drug therapy: Secondary | ICD-10-CM | POA: Diagnosis not present

## 2014-07-21 DIAGNOSIS — K429 Umbilical hernia without obstruction or gangrene: Secondary | ICD-10-CM | POA: Insufficient documentation

## 2014-07-21 DIAGNOSIS — C187 Malignant neoplasm of sigmoid colon: Secondary | ICD-10-CM

## 2014-07-21 DIAGNOSIS — R109 Unspecified abdominal pain: Secondary | ICD-10-CM | POA: Insufficient documentation

## 2014-07-21 LAB — BASIC METABOLIC PANEL (CC13)
ANION GAP: 9 meq/L (ref 3–11)
BUN: 11 mg/dL (ref 7.0–26.0)
CALCIUM: 9.5 mg/dL (ref 8.4–10.4)
CO2: 27 mEq/L (ref 22–29)
Chloride: 104 mEq/L (ref 98–109)
Creatinine: 1 mg/dL (ref 0.6–1.1)
EGFR: 68 mL/min/{1.73_m2} — ABNORMAL LOW (ref >90)
GLUCOSE: 129 mg/dL (ref 70–140)
Potassium: 3.5 mEq/L (ref 3.5–5.1)
Sodium: 141 mEq/L (ref 136–145)

## 2014-07-21 LAB — CEA: CEA: 1 ng/mL (ref 0.0–5.0)

## 2014-07-21 MED ORDER — IOHEXOL 300 MG/ML  SOLN
100.0000 mL | Freq: Once | INTRAMUSCULAR | Status: AC | PRN
  Administered 2014-07-21: 100 mL via INTRAVENOUS

## 2014-07-23 ENCOUNTER — Ambulatory Visit: Payer: BC Managed Care – PPO | Admitting: Nurse Practitioner

## 2014-07-24 ENCOUNTER — Telehealth: Payer: Self-pay | Admitting: Oncology

## 2014-07-24 ENCOUNTER — Encounter: Payer: Self-pay | Admitting: Nurse Practitioner

## 2014-07-24 ENCOUNTER — Encounter: Payer: Self-pay | Admitting: Internal Medicine

## 2014-07-24 ENCOUNTER — Ambulatory Visit (HOSPITAL_BASED_OUTPATIENT_CLINIC_OR_DEPARTMENT_OTHER): Payer: BLUE CROSS/BLUE SHIELD | Admitting: Nurse Practitioner

## 2014-07-24 VITALS — BP 151/68 | HR 106 | Temp 98.4°F | Resp 20 | Ht 66.0 in | Wt 173.0 lb

## 2014-07-24 DIAGNOSIS — C187 Malignant neoplasm of sigmoid colon: Secondary | ICD-10-CM

## 2014-07-24 NOTE — Progress Notes (Signed)
  [place] OFFICE PROGRESS NOTE   Diagnosis:  Colon cancer  INTERVAL HISTORY:   Christine Shepherd returns as scheduled. She feels well. No change in bowel habits. No bloody or black stools. No nausea or vomiting. She has occasional pain at the right lower lateral abdomen. She reports a good appetite. No shortness of breath or cough.  Objective:  Vital signs in last 24 hours:  Blood pressure 151/68, pulse 106, temperature 98.4 F (36.9 C), temperature source Oral, resp. rate 20, height _0  (1.676 m), weight 173 lb (78.472 kg), SpO2 100 %.    HEENT: No thrush or ulcers. Right thyroid mass. Lymphatics: No palpable cervical, supra clavicular, axillary or inguinal lymph nodes. Resp: Lungs clear bilaterally. Cardio: Regular rate and rhythm. GI: Abdomen soft and nontender. No hepatomegaly. No mass. Vascular: No leg edema.     Lab Results:  Lab Results  Component Value Date   WBC 3.7* 03/28/2014   HGB 13.3 03/28/2014   HCT 40.5 03/28/2014   MCV 106.8* 03/28/2014   PLT 149 03/28/2014   NEUTROABS 1.6 03/28/2014   07/21/2014 CEA 1.0  Imaging:  No results found.  Medications: I have reviewed the patient's current medications.  Assessment/Plan: 1. 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   No loss of mismatch repair protein expression  Initiation of adjuvant Xeloda 09/14/2013. Cycle 8 completed beginning 02/16/2014  CT scans chest/abdomen/pelvis 07/21/2014 with no evidence of recurrent or metastatic disease. Thyroid goiter noted. 2. History of multiple colon polyps. 3. Thyromegaly with multiple thyroid nodules-status post right and left thyroid nodule biopsies on 08/07/2013-benign. 4. Hypothyroidism. 5. Hypertension.  6. History of hand-foot syndrome secondary to Xeloda.   Disposition: Christine Shepherd appears well. Restaging CT scans show no evidence  of recurrent or metastatic disease. We made a referral to Dr. Olevia Perches for a one-year surveillance colonoscopy. Christine Shepherd will return for a follow-up visit and CEA in 6 months.   Ned Card ANP/GNP-BC   07/24/2014  3:09 PM

## 2014-07-24 NOTE — Telephone Encounter (Signed)
gv adn printed appt sched and avs for pt for July.Christine KitchenMarland KitchenPt sched with Dr. Olevia Perches on 2.19 @ 430pm

## 2014-08-08 ENCOUNTER — Ambulatory Visit
Admission: RE | Admit: 2014-08-08 | Discharge: 2014-08-08 | Disposition: A | Discharge status: Home / Self Care | Payer: BLUE CROSS/BLUE SHIELD | Source: Ambulatory Visit | Attending: Internal Medicine | Admitting: Internal Medicine

## 2014-08-08 DIAGNOSIS — E042 Nontoxic multinodular goiter: Secondary | ICD-10-CM

## 2014-08-20 ENCOUNTER — Other Ambulatory Visit: Payer: Self-pay | Admitting: Physician Assistant

## 2014-08-20 ENCOUNTER — Ambulatory Visit (INDEPENDENT_AMBULATORY_CARE_PROVIDER_SITE_OTHER): Payer: BLUE CROSS/BLUE SHIELD | Admitting: Family Medicine

## 2014-08-20 VITALS — BP 178/82 | HR 83 | Temp 98.3°F | Resp 16 | Ht 64.25 in | Wt 171.5 lb

## 2014-08-20 DIAGNOSIS — R42 Dizziness and giddiness: Secondary | ICD-10-CM

## 2014-08-20 LAB — POCT URINALYSIS DIPSTICK
Bilirubin, UA: NEGATIVE
Blood, UA: NEGATIVE
Glucose, UA: NEGATIVE
Ketones, UA: NEGATIVE
Leukocytes, UA: NEGATIVE
Nitrite, UA: NEGATIVE
Protein, UA: NEGATIVE
Spec Grav, UA: 1.015
UROBILINOGEN UA: 0.2
pH, UA: 7.5

## 2014-08-20 LAB — POCT CBC
Granulocyte percent: 53.6 %G (ref 37–80)
HCT, POC: 44.4 % (ref 37.7–47.9)
HEMOGLOBIN: 14.3 g/dL (ref 12.2–16.2)
LYMPH, POC: 2.4 (ref 0.6–3.4)
MCH: 29.6 pg (ref 27–31.2)
MCHC: 32.2 g/dL (ref 31.8–35.4)
MCV: 91.9 fL (ref 80–97)
MID (CBC): 0.5 (ref 0–0.9)
MPV: 8.3 fL (ref 0–99.8)
PLATELET COUNT, POC: 218 10*3/uL (ref 142–424)
POC GRANULOCYTE: 3.3 (ref 2–6.9)
POC LYMPH %: 38.9 % (ref 10–50)
POC MID %: 7.5 %M (ref 0–12)
RBC: 4.83 M/uL (ref 4.04–5.48)
RDW, POC: 14.9 %
WBC: 6.1 10*3/uL (ref 4.6–10.2)

## 2014-08-20 LAB — POCT UA - MICROSCOPIC ONLY
CASTS, UR, LPF, POC: NEGATIVE
Crystals, Ur, HPF, POC: NEGATIVE
RBC, urine, microscopic: NEGATIVE
WBC, Ur, HPF, POC: NEGATIVE
Yeast, UA: NEGATIVE

## 2014-08-20 LAB — GLUCOSE, POCT (MANUAL RESULT ENTRY): POC Glucose: 116 mg/dl — AB (ref 70–99)

## 2014-08-20 MED ORDER — CETIRIZINE HCL 10 MG PO TABS: 10.0000 mg | ORAL_TABLET | Freq: Every day | ORAL | Status: DC

## 2014-08-20 NOTE — Progress Notes (Signed)
08/20/2014 at 7:59 PM  Christine Shepherd / DOB: 1950/08/14 / MRN: [id number]  The patient has Hypertension; Multiple thyroid nodules; Cancer of sigmoid colon T2N1M0 s/p robotic colectomy 08/17/621; Umbilical hernia s/p primary repair 08/13/2013; and Ventral incisional hernia s/p primary repair 08/13/2013 on her problem list.  SUBJECTIVE  Chief compalaint: Dizziness   History of present illness: Christine Shepherd is 64 y.o. well appearing female presenting with a history of HTN presenting for dizziness.  She denies a spinning sensation, and she denies the feeling of presyncope, nausea,  Palpitations, and HA. She does report a new sinus congestion but this is not new.  Onset was 7 day ago, with stable course since that time.   She has tried nothing for this problem. She denies changes in eating drinking and appetite.  She has a history of HTN and reports that her BP is always high at the clinic. She reports being compliant with Diovan. She denies chest pain and palpitations.    She  has a past medical history of Hypertension; Thyroid disease; Gallstones; Acute cholecystitis with chronic cholecystitis s/p lap chole [id number] (11/15/2011); Complication of anesthesia (06/27/2013); and Hypothyroidism.    She has a current medication list which includes the following prescription(s): estradiol, levothyroxine, lorazepam, valacyclovir, valsartan-hydrochlorothiazide, and klor-con m20.  Christine Shepherd has No Known Allergies. She  reports that she has never smoked. She has never used smokeless tobacco. She reports that she does not drink alcohol or use illicit drugs. She  reports that she currently engages in sexual activity.  The patient  has past surgical history that includes Dilation and curettage of uterus; Tubal ligation; Flexible sigmoidoscopy (N/A, 07/22/2013); Abdominal hysterectomy; Cholecystectomy (11/17/11); and Colon surgery.  Her family history includes Cancer in her brother and father. There is no history of Colon  cancer.  Review of Systems  Constitutional: Negative for fever, chills and weight loss.  HENT: Positive for congestion (normal). Negative for ear discharge, ear pain, nosebleeds and tinnitus.   Respiratory: Negative for cough.   Cardiovascular: Negative for chest pain and palpitations.  Genitourinary: Positive for frequency.  Musculoskeletal: Negative for myalgias.  Skin: Negative for itching and rash.  Neurological: Positive for dizziness and headaches. Negative for tingling, tremors, sensory change, speech change, focal weakness, seizures and loss of consciousness.    OBJECTIVE  Her  height is 5' 4.25" (1.632 m) and weight is 171 lb 8 oz (77.792 kg). Her oral temperature is 98.3 F (36.8 C). Her blood pressure is 178/82 and her pulse is 83. Her respiration is 16 and oxygen saturation is 100%.  The patient's body mass index is 29.21 kg/(m^2).  Physical Exam  Constitutional: She is oriented to person, place, and time.  Neck: No thyromegaly present.  Cardiovascular: Normal rate and regular rhythm.   Respiratory: Effort normal and breath sounds normal.  GI: Soft. Bowel sounds are normal.  Musculoskeletal: Normal range of motion.  Neurological: She is alert and oriented to person, place, and time. No cranial nerve deficit. Coordination normal.  Skin: Skin is warm and dry.  Psychiatric: She has a normal mood and affect.    Results for orders placed or performed in visit on 08/20/14 (from the past 24 hour(s))  POCT UA - Microscopic Only     Status: None   Collection Time: 08/20/14  7:46 PM  Result Value Ref Range   WBC, Ur, HPF, POC neg    RBC, urine, microscopic neg    Bacteria, U Microscopic trace    Mucus,  UA trace    Epithelial cells, urine per micros 0-2    Crystals, Ur, HPF, POC neg    Casts, Ur, LPF, POC neg    Yeast, UA neg   POCT urinalysis dipstick     Status: None   Collection Time: 08/20/14  7:46 PM  Result Value Ref Range   Color, UA yellow    Clarity, UA clear      Glucose, UA neg    Bilirubin, UA neg    Ketones, UA neg    Spec Grav, UA 1.015    Blood, UA neg    pH, UA 7.5    Protein, UA neg    Urobilinogen, UA 0.2    Nitrite, UA neg    Leukocytes, UA Negative   POCT CBC     Status: None   Collection Time: 08/20/14  7:46 PM  Result Value Ref Range   WBC 6.1 4.6 - 10.2 K/uL   Lymph, poc 2.4 0.6 - 3.4   POC LYMPH PERCENT 38.9 10 - 50 %L   MID (cbc) 0.5 0 - 0.9   POC MID % 7.5 0 - 12 %M   POC Granulocyte 3.3 2 - 6.9   Granulocyte percent 53.6 37 - 80 %G   RBC 4.83 4.04 - 5.48 M/uL   Hemoglobin 14.3 12.2 - 16.2 g/dL   HCT, POC 44.4 37.7 - 47.9 %   MCV 91.9 80 - 97 fL   MCH, POC 29.6 27 - 31.2 pg   MCHC 32.2 31.8 - 35.4 g/dL   RDW, POC 14.9 %   Platelet Count, POC 218 142 - 424 K/uL   MPV 8.3 0 - 99.8 fL  POCT glucose (manual entry)     Status: Abnormal   Collection Time: 08/20/14  7:46 PM  Result Value Ref Range   POC Glucose 116 (A) 70 - 99 mg/dl   EKG: WNL  ASSESSMENT & PLAN  Christine Shepherd was seen today for dizziness.  Diagnoses and all orders for this visit:  Dizziness; Work up thus far and exam reassuring. I doubt this dizziness has a neurologic or cardiac etiology.  Will treat for sinus congestion and possible eustachian tube dysfunction with zrytec 10 mg qd.  Patient was advised to check her BP daily for the next week and to f/u with her PCP after this period of monitoring.  Orders: -     EKG 12-Lead -     POCT UA - Microscopic Only -     POCT urinalysis dipstick -     TSH -     POCT CBC -     POCT glucose (manual entry) -     Comprehensive metabolic panel   The patient was advised to call or come back to clinic if she does not see an improvement in symptoms, or worsens with the above plan.   Philis Fendt, MHS, PA-C [facility] 08/20/2014 7:59 PM

## 2014-08-21 LAB — COMPREHENSIVE METABOLIC PANEL
ALK PHOS: 54 U/L (ref 39–117)
ALT: 10 U/L (ref 0–35)
AST: 12 U/L (ref 0–37)
Albumin: 4.6 g/dL (ref 3.5–5.2)
BILIRUBIN TOTAL: 0.6 mg/dL (ref 0.2–1.2)
BUN: 12 mg/dL (ref 6–23)
CO2: 27 mEq/L (ref 19–32)
CREATININE: 0.91 mg/dL (ref 0.50–1.10)
Calcium: 10 mg/dL (ref 8.4–10.5)
Chloride: 101 mEq/L (ref 96–112)
Glucose, Bld: 115 mg/dL — ABNORMAL HIGH (ref 70–99)
Potassium: 3.4 mEq/L — ABNORMAL LOW (ref 3.5–5.3)
Sodium: 139 mEq/L (ref 135–145)
TOTAL PROTEIN: 7.9 g/dL (ref 6.0–8.3)

## 2014-08-21 LAB — TSH: TSH: 0.125 u[IU]/mL — AB (ref 0.350–4.500)

## 2014-08-22 NOTE — Progress Notes (Signed)
EKG read and patient discussed with Mr. Carlis Abbott. Agree with assessment and plan of care per his note.

## 2014-08-23 ENCOUNTER — Other Ambulatory Visit: Payer: Self-pay | Admitting: Physician Assistant

## 2014-08-24 LAB — T4, FREE: Free T4: 1.54 ng/dL (ref 0.80–1.80)

## 2014-08-24 LAB — T3, FREE: T3, Free: 2.7 pg/mL (ref 2.3–4.2)

## 2014-08-29 ENCOUNTER — Ambulatory Visit (AMBULATORY_SURGERY_CENTER): Payer: Self-pay

## 2014-08-29 VITALS — Ht 64.0 in | Wt 173.0 lb

## 2014-08-29 DIAGNOSIS — C189 Malignant neoplasm of colon, unspecified: Secondary | ICD-10-CM

## 2014-08-29 MED ORDER — MOVIPREP 100 G PO SOLR: 1.0000 | Freq: Once | ORAL | Status: DC

## 2014-08-29 NOTE — Progress Notes (Signed)
Other than nausea no past problems with anesthesia No allergies to eggs or soy No home oxygen No diet/weight loss meds  Has email  Emmi instructions given for colonoscopy

## 2014-09-12 ENCOUNTER — Encounter: Payer: Self-pay | Admitting: Internal Medicine

## 2014-09-12 ENCOUNTER — Ambulatory Visit (AMBULATORY_SURGERY_CENTER): Payer: BLUE CROSS/BLUE SHIELD | Admitting: Internal Medicine

## 2014-09-12 VITALS — BP 143/81 | HR 66 | Temp 98.7°F | Resp 16 | Ht 64.0 in | Wt 173.0 lb

## 2014-09-12 DIAGNOSIS — D125 Benign neoplasm of sigmoid colon: Secondary | ICD-10-CM

## 2014-09-12 DIAGNOSIS — K635 Polyp of colon: Secondary | ICD-10-CM

## 2014-09-12 DIAGNOSIS — C189 Malignant neoplasm of colon, unspecified: Secondary | ICD-10-CM

## 2014-09-12 DIAGNOSIS — C187 Malignant neoplasm of sigmoid colon: Secondary | ICD-10-CM

## 2014-09-12 MED ORDER — SODIUM CHLORIDE 0.9 % IV SOLN: 500.0000 mL | INTRAVENOUS | Status: DC

## 2014-09-12 NOTE — Progress Notes (Signed)
Called to room to assist during endoscopic procedure.  Patient ID and intended procedure confirmed with present staff. Received instructions for my participation in the procedure from the performing physician.  

## 2014-09-12 NOTE — Op Note (Signed)
[facility]  [address] [place], [address]   COLONOSCOPY PROCEDURE REPORT  PATIENT: [name], [name]  MR#: [id number] BIRTHDATE: 12-21-1950 , 43  yrs. old GENDER: female ENDOSCOPIST: Lafayette Dragon, MD REFERRED [place]:YWVPX Nyoka Cowden, M.D.  Merri Ray, M.D., Dr Julieanne Manson, Dr Clyda Greener PROCEDURE DATE:  09/12/2014 PROCEDURE:   Colonoscopy with cold biopsy polypectomy First Screening Colonoscopy - Avg.  risk and is 50 yrs.  old or older - No.  Prior Negative Screening - Now for repeat screening. N/A  History of Adenoma - Now for follow-up colonoscopy & has been > or = to 3 yrs.  Yes hx of adenoma.  Has been 3 or more years since last colonoscopy.  History of Adenoma - Now for follow-up colonoscopy & has been > or = to 3 yrs.  No.  It has been less than 3 yrs since last colonoscopy.  Other: See Comments  Polyps Removed Today? Yes. ASA CLASS:   Class II INDICATIONS:Stage III,Tx,N1  adenocarcinoma of the sigmoid colon resected in February 2015.  Patient had several adenomatous polyps. This is the first surveillence colonoscopy. MEDICATIONS: Monitored anesthesia care and Propofol 300 mg IV  DESCRIPTION OF PROCEDURE:   After the risks benefits and alternatives of the procedure were thoroughly explained, informed consent was obtained.  The digital rectal exam revealed no abnormalities of the rectum.   The [id number] 1528  endoscope was introduced through the anus and advanced to the cecum, which was identified by both the appendix and ileocecal valve. No adverse events experienced.   The quality of the prep was good, using MoviPrep  The instrument was then slowly withdrawn as the colon was fully examined.      COLON FINDINGS: There was evidence of a prior end-to-end colo-colonic surgical anastomosis in the rectosigmoid colon. There was mild diverticulosis noted in the descending colon.   Two sessile polyps measuring 2 mm in size were found in the sigmoid colon.  A  polypectomy was performed with cold forceps.  The resection was complete, the polyp tissue was completely retrieved and sent to histology.  Retroflexed views revealed no abnormalities. The time to cecum=8 minutes 34 seconds.  Withdrawal time=7 minutes 07 seconds.  The scope was withdrawn and the procedure completed. COMPLICATIONS: There were no immediate complications.  ENDOSCOPIC IMPRESSION: 1.   There was evidence of a prior colo-colonic surgical anastomosis in the rectosigmoid colon  at 20 cm,anastomosis was wide open 2.   Mild diverticulosis was noted in the descending colon 3.   Two diminutive 71mm sessile polyps were found in the sigmoid colon at the anastomosis, polypectomy was performed with cold forceps 4.no evidence for recurrent cancer  RECOMMENDATIONS: 1.  Await pathology results 2.  High fiber diet Recall colonoscopy in one year  eSigned:  Lafayette Dragon, MD 09/12/2014 10:04 AM   cc:   PATIENT NAME:  Christine, Shepherd MR#: [id number]

## 2014-09-12 NOTE — Patient Instructions (Signed)
YOU HAD AN ENDOSCOPIC PROCEDURE TODAY AT THE [facility]:   Refer to the procedure report that was given to you for any specific questions about what was found during the examination.  If the procedure report does not answer your questions, please call your gastroenterologist to clarify.  If you requested that your care partner not be given the details of your procedure findings, then the procedure report has been included in a sealed envelope for you to review at your convenience later.  YOU SHOULD EXPECT: Some feelings of bloating in the abdomen. Passage of more gas than usual.  Walking can help get rid of the air that was put into your GI tract during the procedure and reduce the bloating. If you had a lower endoscopy (such as a colonoscopy or flexible sigmoidoscopy) you may notice spotting of blood in your stool or on the toilet paper. If you underwent a bowel prep for your procedure, you may not have a normal bowel movement for a few days.  Please Note:  You might notice some irritation and congestion in your nose or some drainage.  This is from the oxygen used during your procedure.  There is no need for concern and it should clear up in a day or so.  SYMPTOMS TO REPORT IMMEDIATELY:   Following lower endoscopy (colonoscopy or flexible sigmoidoscopy):  Excessive amounts of blood in the stool  Significant tenderness or worsening of abdominal pains  Swelling of the abdomen that is new, acute  Fever of 100F or higher    For urgent or emergent issues, a gastroenterologist can be reached at any hour by calling [phone].   DIET: Your first meal following the procedure should be a small meal and then it is ok to progress to your normal diet. Heavy or fried foods are harder to digest and may make you feel nauseous or bloated.  Likewise, meals heavy in dairy and vegetables can increase bloating.  Drink plenty of fluids but you should avoid alcoholic beverages for 24  hours.  ACTIVITY:  You should plan to take it easy for the rest of today and you should NOT DRIVE or use heavy machinery until tomorrow (because of the sedation medicines used during the test).    FOLLOW UP: Our staff will call the number listed on your records the next business day following your procedure to check on you and address any questions or concerns that you may have regarding the information given to you following your procedure. If we do not reach you, we will leave a message.  However, if you are feeling well and you are not experiencing any problems, there is no need to return our call.  We will assume that you have returned to your regular daily activities without incident.  If any biopsies were taken you will be contacted by phone or by letter within the next 1-3 weeks.  Please call us at [phone] if you have not heard about the biopsies in 3 weeks.    SIGNATURES/CONFIDENTIALITY: You and/or your care partner have signed paperwork which will be entered into your electronic medical record.  These signatures attest to the fact that that the information above on your After Visit Summary has been reviewed and is understood.  Full responsibility of the confidentiality of this discharge information lies with you and/or your care-partner.   Resume medications. Information given on polyps, diverticulosis and high fiber diet with discharge instructions. 

## 2014-09-12 NOTE — Progress Notes (Signed)
Report to PACU, RN, vss, BBS= Clear.  

## 2014-09-15 ENCOUNTER — Telehealth: Payer: Self-pay

## 2014-09-15 NOTE — Telephone Encounter (Signed)
  Follow up Call-  Call back number 09/12/2014 06/28/2013  Post procedure Call Back phone  # [phone]  Permission to leave phone message Yes Yes     Patient questions:  Do you have a fever, pain , or abdominal swelling? No. Pain Score  0 *  Have you tolerated food without any problems? Yes.    Have you been able to return to your normal activities? Yes.    Do you have any questions about your discharge instructions: Diet   No. Medications  No. Follow up visit  No.  Do you have questions or concerns about your Care? No.  Actions: * If pain score is 4 or above: No action needed, pain <4.  No problems per the pt. maw

## 2014-09-18 ENCOUNTER — Encounter: Payer: Self-pay | Admitting: Internal Medicine

## 2015-01-10 ENCOUNTER — Ambulatory Visit (INDEPENDENT_AMBULATORY_CARE_PROVIDER_SITE_OTHER): Payer: BLUE CROSS/BLUE SHIELD | Admitting: Urgent Care

## 2015-01-10 VITALS — BP 138/72 | HR 74 | Temp 98.5°F | Resp 17 | Ht 65.0 in | Wt 175.2 lb

## 2015-01-10 DIAGNOSIS — R059 Cough, unspecified: Secondary | ICD-10-CM

## 2015-01-10 DIAGNOSIS — R0981 Nasal congestion: Secondary | ICD-10-CM

## 2015-01-10 DIAGNOSIS — J029 Acute pharyngitis, unspecified: Secondary | ICD-10-CM | POA: Diagnosis not present

## 2015-01-10 DIAGNOSIS — R0982 Postnasal drip: Secondary | ICD-10-CM

## 2015-01-10 DIAGNOSIS — R05 Cough: Secondary | ICD-10-CM | POA: Diagnosis not present

## 2015-01-10 DIAGNOSIS — B349 Viral infection, unspecified: Secondary | ICD-10-CM

## 2015-01-10 MED ORDER — HYDROCODONE-HOMATROPINE 5-1.5 MG/5ML PO SYRP: 5.0000 mL | ORAL_SOLUTION | Freq: Every evening | ORAL | Status: DC | PRN

## 2015-01-10 MED ORDER — PSEUDOEPHEDRINE HCL ER 120 MG PO TB12: 120.0000 mg | ORAL_TABLET | Freq: Two times a day (BID) | ORAL | Status: DC

## 2015-01-10 MED ORDER — IPRATROPIUM BROMIDE 0.03 % NA SOLN: 2.0000 | Freq: Two times a day (BID) | NASAL | Status: DC

## 2015-01-10 NOTE — Patient Instructions (Signed)
Upper Respiratory Infection, Adult An upper respiratory infection (URI) is also sometimes known as the common cold. The upper respiratory tract includes the nose, sinuses, throat, trachea, and bronchi. Bronchi are the airways leading to the lungs. Most people improve within 1 week, but symptoms can last up to 2 weeks. A residual cough may last even longer.  CAUSES Many different viruses can infect the tissues lining the upper respiratory tract. The tissues become irritated and inflamed and often become very moist. Mucus production is also common. A cold is contagious. You can easily spread the virus to others by oral contact. This includes kissing, sharing a glass, coughing, or sneezing. Touching your mouth or nose and then touching a surface, which is then touched by another person, can also spread the virus. SYMPTOMS  Symptoms typically develop 1 to 3 days after you come in contact with a cold virus. Symptoms vary from person to person. They may include:  Runny nose.  Sneezing.  Nasal congestion.  Sinus irritation.  Sore throat.  Loss of voice (laryngitis).  Cough.  Fatigue.  Muscle aches.  Loss of appetite.  Headache.  Low-grade fever. DIAGNOSIS  You might diagnose your own cold based on familiar symptoms, since most people get a cold 2 to 3 times a year. Your caregiver can confirm this based on your exam. Most importantly, your caregiver can check that your symptoms are not due to another disease such as strep throat, sinusitis, pneumonia, asthma, or epiglottitis. Blood tests, throat tests, and X-rays are not necessary to diagnose a common cold, but they may sometimes be helpful in excluding other more serious diseases. Your caregiver will decide if any further tests are required. RISKS AND COMPLICATIONS  You may be at risk for a more severe case of the common cold if you smoke cigarettes, have chronic heart disease (such as heart failure) or lung disease (such as asthma), or if  you have a weakened immune system. The very young and very old are also at risk for more serious infections. Bacterial sinusitis, middle ear infections, and bacterial pneumonia can complicate the common cold. The common cold can worsen asthma and chronic obstructive pulmonary disease (COPD). Sometimes, these complications can require emergency medical care and may be life-threatening. PREVENTION  The best way to protect against getting a cold is to practice good hygiene. Avoid oral or hand contact with people with cold symptoms. Wash your hands often if contact occurs. There is no clear evidence that vitamin C, vitamin E, echinacea, or exercise reduces the chance of developing a cold. However, it is always recommended to get plenty of rest and practice good nutrition. TREATMENT  Treatment is directed at relieving symptoms. There is no cure. Antibiotics are not effective, because the infection is caused by a virus, not by bacteria. Treatment may include:  Increased fluid intake. Sports drinks offer valuable electrolytes, sugars, and fluids.  Breathing heated mist or steam (vaporizer or shower).  Eating chicken soup or other clear broths, and maintaining good nutrition.  Getting plenty of rest.  Using gargles or lozenges for comfort.  Controlling fevers with ibuprofen or acetaminophen as directed by your caregiver.  Increasing usage of your inhaler if you have asthma. Zinc gel and zinc lozenges, taken in the first 24 hours of the common cold, can shorten the duration and lessen the severity of symptoms. Pain medicines may help with fever, muscle aches, and throat pain. A variety of non-prescription medicines are available to treat congestion and runny nose. Your caregiver   can make recommendations and may suggest nasal or lung inhalers for other symptoms.  HOME CARE INSTRUCTIONS   Only take over-the-counter or prescription medicines for pain, discomfort, or fever as directed by your  caregiver.  Use a warm mist humidifier or inhale steam from a shower to increase air moisture. This may keep secretions moist and make it easier to breathe.  Drink enough water and fluids to keep your urine clear or pale yellow.  Rest as needed.  Return to work when your temperature has returned to normal or as your caregiver advises. You may need to stay home longer to avoid infecting others. You can also use a face mask and careful hand washing to prevent spread of the virus. SEEK MEDICAL CARE IF:   After the first few days, you feel you are getting worse rather than better.  You need your caregiver's advice about medicines to control symptoms.  You develop chills, worsening shortness of breath, or brown or red sputum. These may be signs of pneumonia.  You develop yellow or brown nasal discharge or pain in the face, especially when you bend forward. These may be signs of sinusitis.  You develop a fever, swollen neck glands, pain with swallowing, or white areas in the back of your throat. These may be signs of strep throat. SEEK IMMEDIATE MEDICAL CARE IF:   You have a fever.  You develop severe or persistent headache, ear pain, sinus pain, or chest pain.  You develop wheezing, a prolonged cough, cough up blood, or have a change in your usual mucus (if you have chronic lung disease).  You develop sore muscles or a stiff neck. Document Released: 12/21/2000 Document Revised: 09/19/2011 Document Reviewed: 10/02/2013 Exit[employer] Patient Information 2015 [employer]. This information is not intended to replace advice given to you by your health care provider. Make sure you discuss any questions you have with your health care provider.  

## 2015-01-10 NOTE — Progress Notes (Signed)
    MRN: [id number] DOB: 1951/02/22  Subjective:   Christine Shepherd is a 64 y.o. female presenting for chief complaint of Nasal Congestion and Sinusitis  Reports 4 day history of Sinus congestion, rhinorrhea, throat pain and occasional non-productive cough, subjective fever. Has tried Tylenol with some relief. Of note, patient has had plenty of sick contacts at work. Denies sinus pain, itchy watery eyes, red eyes, ear fullness, ear pain, wheezing, shortness of breath, chest tightness, chest pain and myalgia, fever, nausea, vomiting and abdominal pain. Has history of allergies, has tried Zyrtec once this past week. Denies any other aggravating or relieving factors, no other questions or concerns.  [name] has a current medication list which includes the following prescription(s): estradiol, levothyroxine, lorazepam, metoprolol succinate, valacyclovir, valsartan-hydrochlorothiazide, and cetirizine. She has No Known Allergies.  Christine Shepherd  has a past medical history of Hypertension; Thyroid disease; Gallstones; Acute cholecystitis with chronic cholecystitis s/p lap chole [id number] (11/15/2011); Complication of anesthesia (06/27/2013); Hypothyroidism; and Environmental allergies. Also  has past surgical history that includes Dilation and curettage of uterus; Tubal ligation; Flexible sigmoidoscopy (N/A, 07/22/2013); Abdominal hysterectomy; Cholecystectomy (11/17/11); Colon surgery; and Colonoscopy.  ROS As in subjective.  Objective:   Vitals: BP 138/72 mmHg  Pulse 74  Temp(Src) 98.5 F (36.9 C) (Oral)  Resp 17  Ht 5\' 5"  (1.651 m)  Wt 175 lb 3.2 oz (79.47 kg)  BMI 29.15 kg/m2  SpO2 98%  Physical Exam  Constitutional: She is oriented to person, place, and time. She appears well-developed and well-nourished.  HENT:  TM's flat bilaterally, no effusions or erythema. Nasal turbinates pink and moist, nasal passages patent, no sinus tenderness. Postnasal drip present, without oropharyngeal exudates, erythema or  abscesses.  Eyes: Pupils are equal, round, and reactive to light. Right eye exhibits no discharge. Left eye exhibits no discharge. No scleral icterus.  Neck: Normal range of motion.  Cardiovascular: Normal rate, regular rhythm and intact distal pulses.  Exam reveals no gallop and no friction rub.   No murmur heard. Pulmonary/Chest: No stridor. No respiratory distress. She has no wheezes. She has no rales.  Musculoskeletal: She exhibits no edema.  Lymphadenopathy:    She has no cervical adenopathy.  Neurological: She is alert and oriented to person, place, and time.  Skin: Skin is warm and dry. No rash noted. No erythema. No pallor.   Assessment and Plan :   1. Viral syndrome 2. Sinus congestion 3. Sore throat 4. Cough 5. PND (post-nasal drip) - Likely undergoing viral syndrome, advised supportive care, Atrovent and Sudafed for congestion, Hycodan for sore throat and cough, ibuprofen or tylenol may be used as well for fevers, continue Zyrtec daily. RTC in 1 week if no improvement in symptoms.  Jaynee Eagles, PA-C [facility] [phone] 01/10/2015 11:09 AM

## 2015-01-30 ENCOUNTER — Ambulatory Visit (HOSPITAL_BASED_OUTPATIENT_CLINIC_OR_DEPARTMENT_OTHER): Payer: BLUE CROSS/BLUE SHIELD | Admitting: Oncology

## 2015-01-30 ENCOUNTER — Other Ambulatory Visit: Payer: BLUE CROSS/BLUE SHIELD

## 2015-01-30 ENCOUNTER — Telehealth: Payer: Self-pay | Admitting: Oncology

## 2015-01-30 VITALS — BP 155/73 | HR 66 | Temp 98.5°F | Resp 18 | Ht 65.0 in | Wt 175.9 lb

## 2015-01-30 DIAGNOSIS — E039 Hypothyroidism, unspecified: Secondary | ICD-10-CM | POA: Diagnosis not present

## 2015-01-30 DIAGNOSIS — I10 Essential (primary) hypertension: Secondary | ICD-10-CM

## 2015-01-30 DIAGNOSIS — C187 Malignant neoplasm of sigmoid colon: Secondary | ICD-10-CM

## 2015-01-30 NOTE — Telephone Encounter (Signed)
per pof to sch pt appt-gave pt copy of avs-gave contrast °

## 2015-01-30 NOTE — Progress Notes (Signed)
  [place] OFFICE PROGRESS NOTE   Diagnosis: Colon cancer  INTERVAL HISTORY:   [name] returns as scheduled. She feels well. No complaint. She went a colonoscopy 09/12/2014. 2 polyps were removed from the sigmoid colon. The pathology revealed one hyperplastic polyp and a fragment of benign polypoid colorectal mucosa. No dysplasia or malignancy identified.  Objective:  Vital signs in last 24 hours:  Blood pressure 155/73, pulse 66, temperature 98.5 F (36.9 C), temperature source Oral, resp. rate 18, height _0  (1.651 m), weight 175 lb 14.4 oz (79.788 kg), SpO2 100 %.    HEENT: Neck without mass Lymphatics: No cervical, supra-clavicular, axillary, or inguinal nodes Resp: Lungs clear bilaterally Cardio: Regular rate and rhythm GI: No hepatosplenomegaly, nontender, no mass Vascular: No leg edema   Lab Results:   Lab Results  Component Value Date   CEA 1.0 07/21/2014    Medications: I have reviewed the patient's current medications.  Assessment/Plan: 1. 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   No loss of mismatch repair protein expression  Initiation of adjuvant Xeloda 09/14/2013. Cycle 8 completed beginning 02/16/2014  CT scans chest/abdomen/pelvis 07/21/2014 with no evidence of recurrent or metastatic disease. Thyroid goiter noted.  Colonoscopy 09/12/2014, height plastic polyp removed from the sigmoid colon 2. History of multiple colon polyps. 3. Thyromegaly with multiple thyroid nodules-status post right and left thyroid nodule biopsies on 08/07/2013-benign. 4. Hypothyroidism. 5. Hypertension.  6. History of hand-foot syndrome secondary to Xeloda.    Disposition:  Christine Shepherd remains in clinical remission from colon cancer. She will return for an office visit and restaging CT evaluation in 6 months. We will follow-up on the CEA  from today.  Betsy Coder, MD  01/30/2015  11:53 AM

## 2015-01-31 LAB — CEA: CEA: 0.8 ng/mL (ref 0.0–5.0)

## 2015-02-03 ENCOUNTER — Telehealth: Payer: Self-pay | Admitting: *Deleted

## 2015-02-03 NOTE — Telephone Encounter (Signed)
Per Dr. Benay Spice; left voice message on home# cea is normal; any questions to call office.

## 2015-02-03 NOTE — Telephone Encounter (Signed)
-----   Message from [name], MD sent at 02/02/2015  5:06 PM EDT ----- Please call patient, cea is normal

## 2015-04-27 ENCOUNTER — Other Ambulatory Visit: Payer: Self-pay

## 2015-04-27 DIAGNOSIS — Z1231 Encounter for screening mammogram for malignant neoplasm of breast: Secondary | ICD-10-CM

## 2015-05-05 ENCOUNTER — Ambulatory Visit
Admission: RE | Admit: 2015-05-05 | Discharge: 2015-05-05 | Disposition: A | Discharge status: Home / Self Care | Payer: BLUE CROSS/BLUE SHIELD | Source: Ambulatory Visit

## 2015-05-05 DIAGNOSIS — Z1231 Encounter for screening mammogram for malignant neoplasm of breast: Secondary | ICD-10-CM

## 2015-06-06 IMAGING — CT CT CHEST W/ CM
2 of 3 series · 14 of 31 positions shown, 16 images · IV contrast (75CC OMNI 300)
Comparison: DG CHEST 2V dated 07/19/2013; CT ABD/PELVIS W CM dated
07/03/2013

CLINICAL DATA: New diagnosis of sigmoid colon carcinoma. Evaluate
for metastasis. History of thyroid disease.

EXAM:
CT CHEST WITH CONTRAST
TECHNIQUE: Multidetector CT imaging of the chest was performed during
intravenous contrast administration.
CONTRAST:  75mL OMNIPAQUE IOHEXOL 300 MG/ML  SOLN

[Series 3: chest with · axial · 0.70mm/px · z∈[-192,+18]mm · 6 of 60 slices shown, 8 images]
[im 9/60  mediastinal]
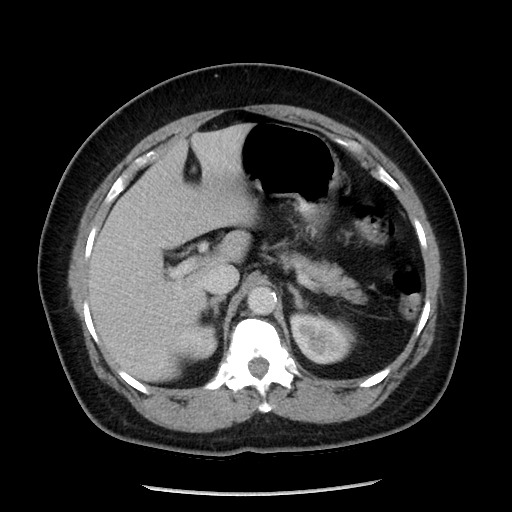
[im 9/60  lung]
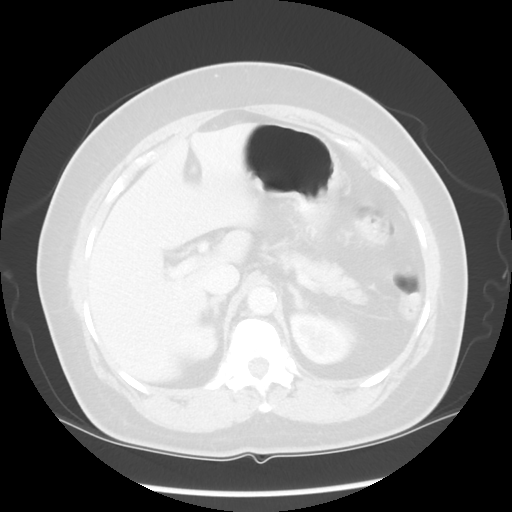
[im 17/60  lung]
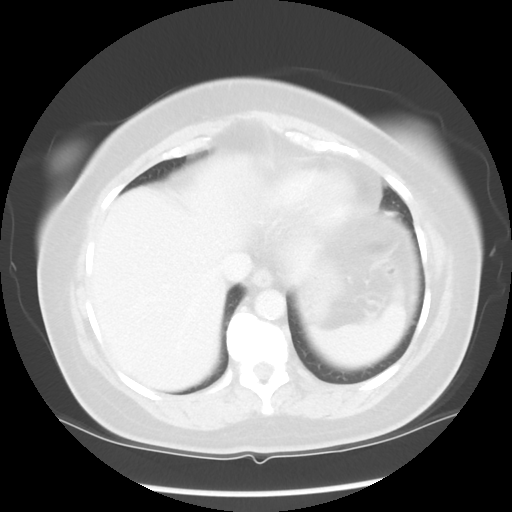
[im 26/60  lung]
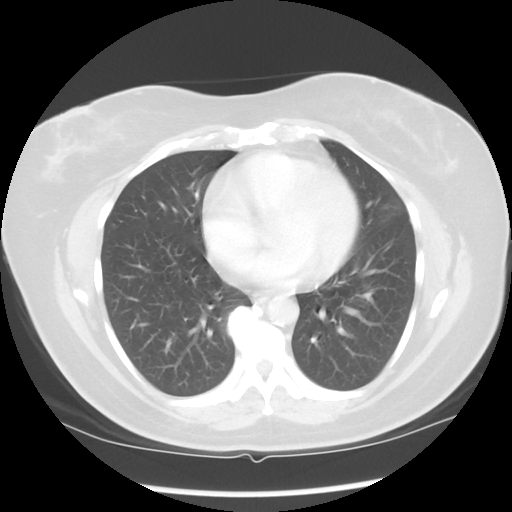
[im 34/60  lung]
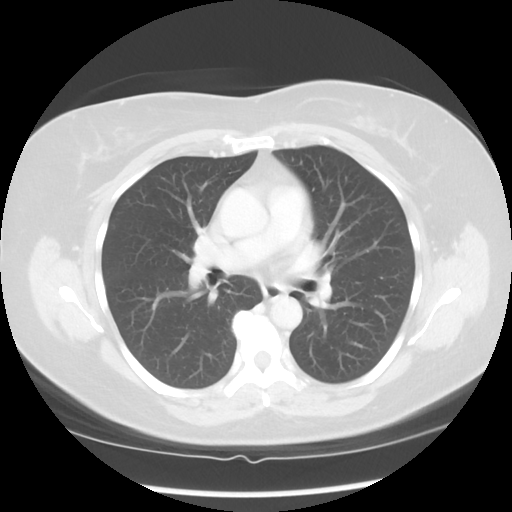
[im 43/60  mediastinal]
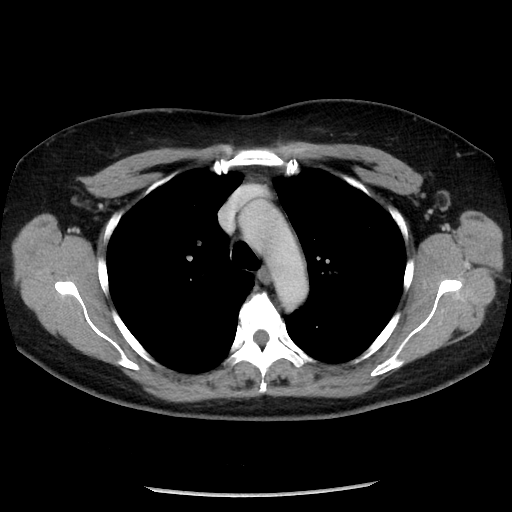
[im 43/60  lung]
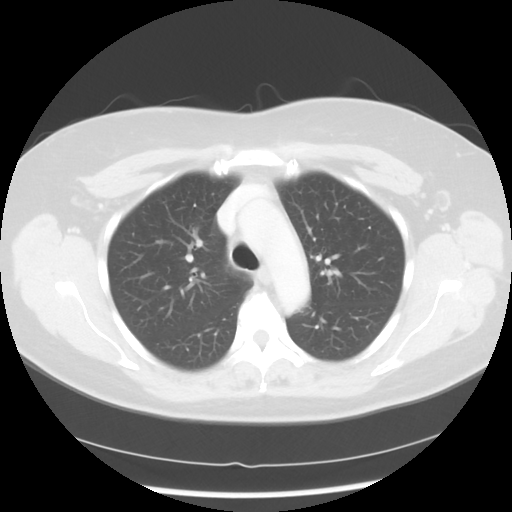
[im 51/60  lung]
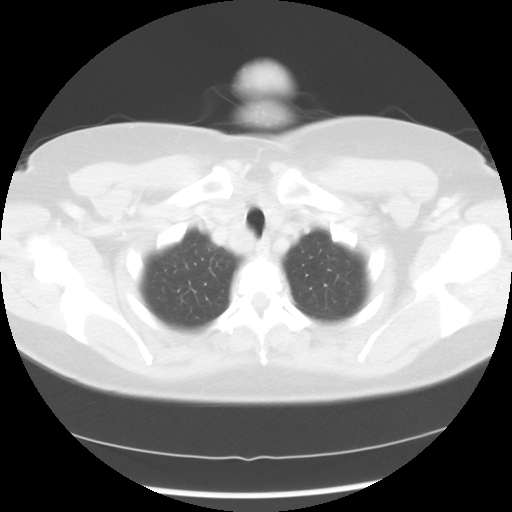

[Series 602: sagittal body · sagittal · 0.70mm/px · 8 of 145 slices shown]
[im 16/145  mediastinal]
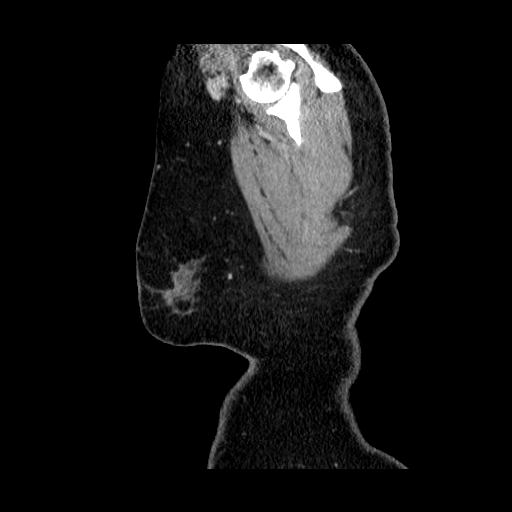
[im 31/145  mediastinal]
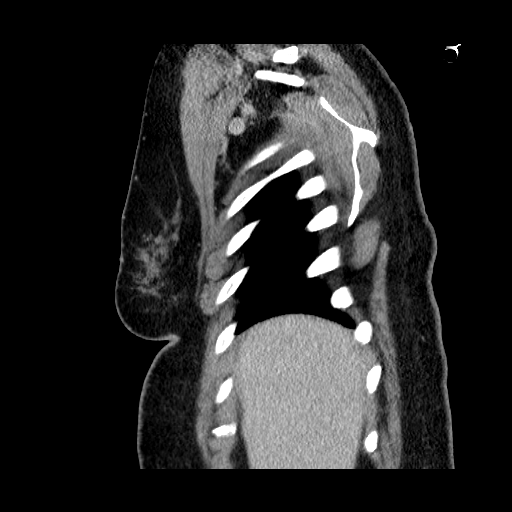
[im 46/145  mediastinal]
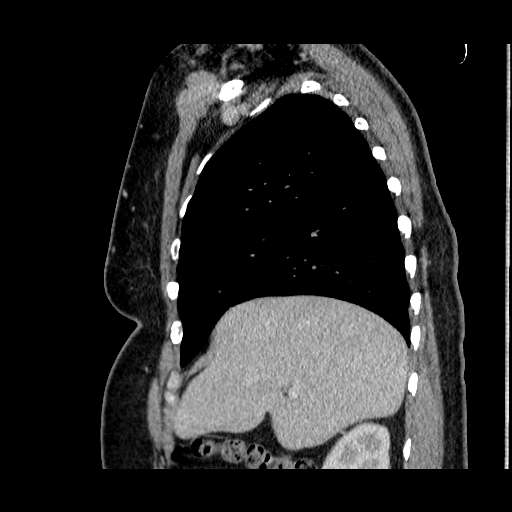
[im 69/145  mediastinal]
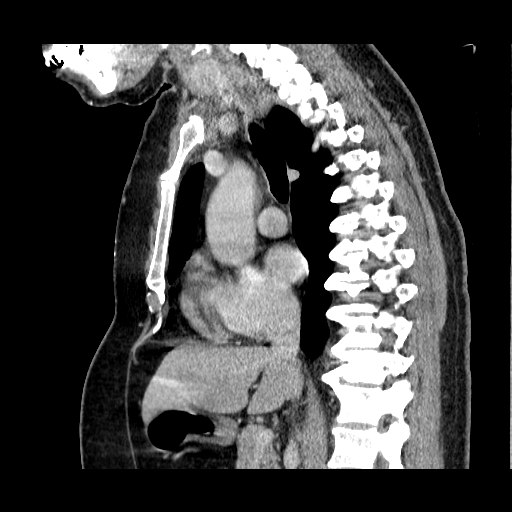
[im 76/145  mediastinal]
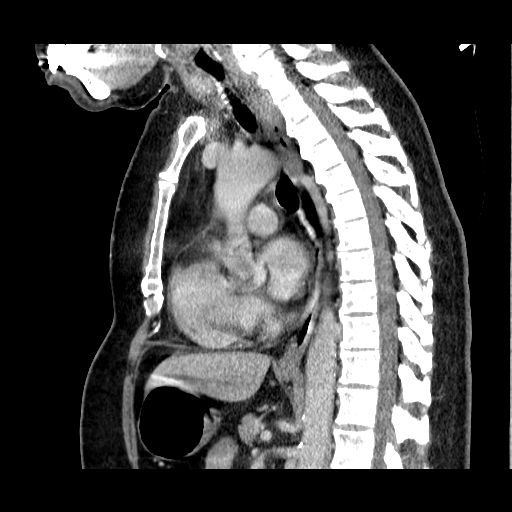
[im 99/145  mediastinal]
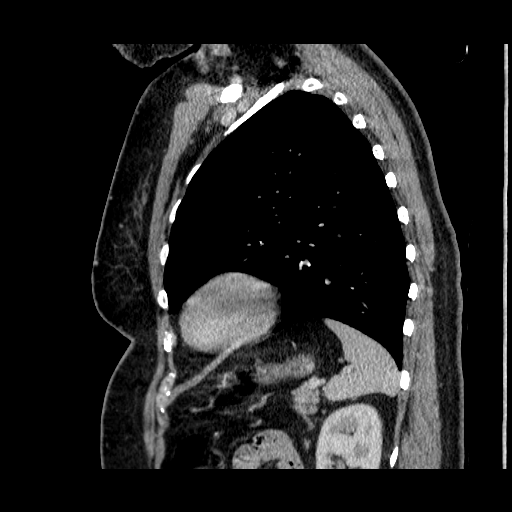
[im 114/145  mediastinal]
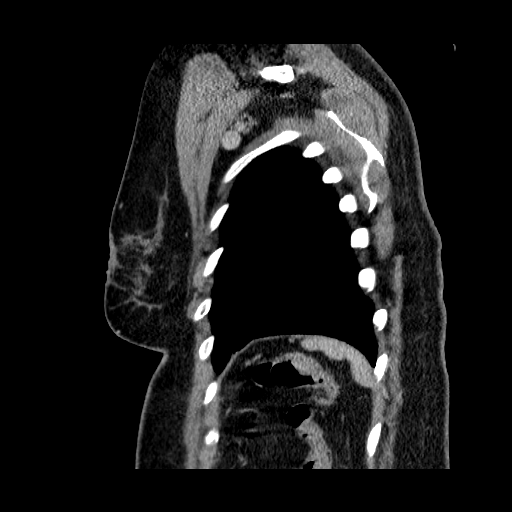
[im 129/145  mediastinal]
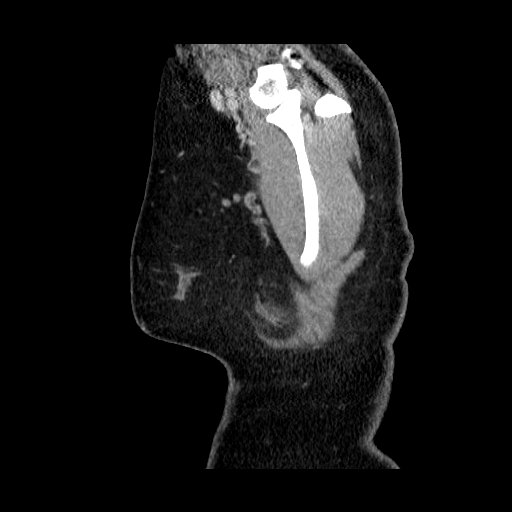

[14 of 31 positions shown; findings below may reference images not displayed]

FINDINGS: Lungs/Pleura:  No nodules or airspace opacities. No pleural fluid.

Heart/Mediastinum:  No supraclavicular adenopathy.

Relatively diffuse enlargement of the thyroid. The right lobe
extends inferiorly into the upper chest. This nodule or extension
measures 2.6 cm on image 8.

Aortic and branch vessel atherosclerosis. Normal heart size, without
pericardial effusion. LAD coronary artery atherosclerosis on image
31. No mediastinal or hilar adenopathy.

Upper Abdomen:  Cholecystectomy.

Bones/Musculoskeletal:  No acute osseous abnormality.
IMPRESSION: 1.  No acute process or evidence of metastatic disease in the chest.
2. Age advanced coronary artery atherosclerosis. Recommend
assessment of coronary risk factors and consideration of medical
therapy.
3. Thyromegaly with a nodule or area of extension into the upper
chest. Consider further evaluation with thyroid ultrasound.

## 2015-08-03 ENCOUNTER — Encounter (HOSPITAL_COMMUNITY): Payer: Self-pay

## 2015-08-03 ENCOUNTER — Other Ambulatory Visit (HOSPITAL_BASED_OUTPATIENT_CLINIC_OR_DEPARTMENT_OTHER): Payer: BLUE CROSS/BLUE SHIELD

## 2015-08-03 ENCOUNTER — Ambulatory Visit (HOSPITAL_COMMUNITY)
Admission: RE | Admit: 2015-08-03 | Discharge: 2015-08-03 | Disposition: A | Discharge status: Home / Self Care | Payer: BLUE CROSS/BLUE SHIELD | Source: Ambulatory Visit | Attending: Oncology | Admitting: Oncology

## 2015-08-03 DIAGNOSIS — C187 Malignant neoplasm of sigmoid colon: Secondary | ICD-10-CM | POA: Diagnosis not present

## 2015-08-03 DIAGNOSIS — E042 Nontoxic multinodular goiter: Secondary | ICD-10-CM | POA: Diagnosis not present

## 2015-08-03 DIAGNOSIS — K439 Ventral hernia without obstruction or gangrene: Secondary | ICD-10-CM | POA: Diagnosis not present

## 2015-08-03 DIAGNOSIS — Z9221 Personal history of antineoplastic chemotherapy: Secondary | ICD-10-CM | POA: Diagnosis not present

## 2015-08-03 LAB — BASIC METABOLIC PANEL
Anion Gap: 10 mEq/L (ref 3–11)
BUN: 13.4 mg/dL (ref 7.0–26.0)
CALCIUM: 9.5 mg/dL (ref 8.4–10.4)
CHLORIDE: 104 meq/L (ref 98–109)
CO2: 25 meq/L (ref 22–29)
Creatinine: 1.1 mg/dL (ref 0.6–1.1)
EGFR: 65 mL/min/{1.73_m2} — AB (ref >90)
GLUCOSE: 129 mg/dL (ref 70–140)
POTASSIUM: 3.6 meq/L (ref 3.5–5.1)
SODIUM: 139 meq/L (ref 136–145)

## 2015-08-03 MED ORDER — IOHEXOL 300 MG/ML  SOLN
100.0000 mL | Freq: Once | INTRAMUSCULAR | Status: AC | PRN
  Administered 2015-08-03: 100 mL via INTRAVENOUS

## 2015-08-04 ENCOUNTER — Telehealth: Payer: Self-pay | Admitting: *Deleted

## 2015-08-04 LAB — CEA: CEA: 1.7 ng/mL (ref 0.0–4.7)

## 2015-08-04 LAB — CEA (PARALLEL TESTING): CEA: 0.7 ng/mL (ref 0.0–5.0)

## 2015-08-04 NOTE — Telephone Encounter (Signed)
-----   Message from Christine Pier, MD sent at 08/03/2015  7:51 PM EST ----- Please call patient, ct is negative for metastatic disease

## 2015-08-04 NOTE — Telephone Encounter (Signed)
Per Dr. Benay Spice; notified pt that ct is negative for metastatic disease.  Pt verbalized understanding and expressed appreciation for call; confirmed appt for 1/30 @ 12n.

## 2015-08-10 ENCOUNTER — Ambulatory Visit (HOSPITAL_BASED_OUTPATIENT_CLINIC_OR_DEPARTMENT_OTHER): Payer: BLUE CROSS/BLUE SHIELD | Admitting: Oncology

## 2015-08-10 ENCOUNTER — Telehealth: Payer: Self-pay | Admitting: Oncology

## 2015-08-10 VITALS — BP 151/70 | HR 70 | Temp 98.4°F | Resp 18 | Ht 65.0 in | Wt 181.6 lb

## 2015-08-10 DIAGNOSIS — E039 Hypothyroidism, unspecified: Secondary | ICD-10-CM | POA: Diagnosis not present

## 2015-08-10 DIAGNOSIS — K625 Hemorrhage of anus and rectum: Secondary | ICD-10-CM | POA: Diagnosis not present

## 2015-08-10 DIAGNOSIS — C187 Malignant neoplasm of sigmoid colon: Secondary | ICD-10-CM | POA: Diagnosis not present

## 2015-08-10 DIAGNOSIS — I10 Essential (primary) hypertension: Secondary | ICD-10-CM

## 2015-08-10 NOTE — Progress Notes (Signed)
  [place] OFFICE PROGRESS NOTE   Diagnosis: Colon cancer  INTERVAL HISTORY:   Ms. Portnoy returns as scheduled. She feels well. Good appetite. She takes "plumb"juice daily and has regular bowel movements. She has a small amount of rectal bleeding when she has a firm bowel movement and "strains ". This does not occur frequently.   Objective:  Vital signs in last 24 hours:  Blood pressure 151/70, pulse 70, temperature 98.4 F (36.9 C), temperature source Oral, resp. rate 18, height '5\' 5"'$  (1.651 m), weight 181 lb 9.6 oz (82.373 kg), SpO2 100 %.    HEENT: Neck without mass, fullness at the right thyroid Lymphatics: No cervical, supraclavicular, axillary, or inguinal nodes Resp: Lungs clear bilaterally Cardio: Regular rate and rhythm GI: No hepatosplenomegaly, no mass, nontender Vascular: No leg edema  Lab Results:   Lab Results  Component Value Date   CEA 0.7 08/03/2015    Imaging:  CTs of the chest, abdomen, and pelvis 08/03/2015-no evidence of metastatic disease Medications: I have reviewed the patient's current medications.  Assessment/Plan: 1. 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   No loss of mismatch repair protein expression  Initiation of adjuvant Xeloda 09/14/2013. Cycle 8 completed beginning 02/16/2014  CT scans chest/abdomen/pelvis 07/21/2014 with no evidence of recurrent or metastatic disease. Thyroid goiter noted.  Colonoscopy 09/12/2014, height plastic polyp removed from the sigmoid colon  CTs of the chest, abdomen, and pelvis 08/03/2015-negative for metastatic disease 2. History of multiple colon polyps. 3. Thyromegaly with multiple thyroid nodules-status post right and left thyroid nodule biopsies on 08/07/2013-benign. 4. Hypothyroidism. 5. Hypertension.  6. History of hand-foot syndrome secondary to  Xeloda.   Disposition:  Ahmed remains in clinical remission from colon cancer. She will return for an office visit and CEA in 6 months. She will be scheduled for one additional surveillance CT scan in one year. She will seek medical attention for persistent rectal bleeding.  Betsy Coder, MD  08/10/2015  12:11 PM

## 2015-08-10 NOTE — Telephone Encounter (Signed)
Pt confirmed labs/ov per 01/30 POF, gave pt AVS and Calendar... KJ °

## 2016-02-05 ENCOUNTER — Other Ambulatory Visit (HOSPITAL_BASED_OUTPATIENT_CLINIC_OR_DEPARTMENT_OTHER): Payer: BLUE CROSS/BLUE SHIELD

## 2016-02-05 ENCOUNTER — Ambulatory Visit (HOSPITAL_BASED_OUTPATIENT_CLINIC_OR_DEPARTMENT_OTHER): Payer: BLUE CROSS/BLUE SHIELD | Admitting: Nurse Practitioner

## 2016-02-05 ENCOUNTER — Telehealth: Payer: Self-pay | Admitting: Oncology

## 2016-02-05 VITALS — BP 154/62 | HR 73 | Temp 98.3°F | Resp 18 | Ht 65.0 in | Wt 182.4 lb

## 2016-02-05 DIAGNOSIS — C187 Malignant neoplasm of sigmoid colon: Secondary | ICD-10-CM | POA: Diagnosis not present

## 2016-02-05 DIAGNOSIS — I10 Essential (primary) hypertension: Secondary | ICD-10-CM | POA: Diagnosis not present

## 2016-02-05 NOTE — Telephone Encounter (Signed)
Gave pt cal & avs °

## 2016-02-05 NOTE — Progress Notes (Signed)
  [place] OFFICE PROGRESS NOTE   Diagnosis:  Colon cancer  INTERVAL HISTORY:   Christine Shepherd returns as scheduled. She feels well. No change in bowel habits. She reports regular bowel movements due to daily "plum" juice. No bloody or black stools. No abdominal pain.  Objective:  Vital signs in last 24 hours:  Blood pressure (!) 154/62, pulse 73, temperature 98.3 F (36.8 C), temperature source Oral, resp. rate 18, height _0  (1.651 m), weight 182 lb 6.4 oz (82.7 kg), SpO2 100 %.    HEENT: No thrush or ulcers. Fullness over the right neck/thyroid. Lymphatics: No palpable cervical, supra clavicular, axillary or inguinal lymph nodes. Resp: Lungs clear bilaterally. Cardio: Regular rate and rhythm. GI: Abdomen soft and nontender. No hepatomegaly. No mass.  Vascular: No leg edema.   Lab Results:  Lab Results  Component Value Date   WBC 6.1 08/20/2014   HGB 14.3 08/20/2014   HCT 44.4 08/20/2014   MCV 91.9 08/20/2014   PLT 149 03/28/2014   NEUTROABS 1.6 03/28/2014    Imaging:  No results found.  Medications: I have reviewed the patient's current medications.  Assessment/Plan: 1. 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   No loss of mismatch repair protein expression  Initiation of adjuvant Xeloda 09/14/2013. Cycle 8 completed beginning 02/16/2014  CT scans chest/abdomen/pelvis 07/21/2014 with no evidence of recurrent or metastatic disease. Thyroid goiter noted.  Colonoscopy 09/12/2014, height plastic polyp removed from the sigmoid colon  CTs of the chest, abdomen, and pelvis 08/03/2015-negative for metastatic disease 2. History of multiple colon polyps. 3. Thyromegaly with multiple thyroid nodules-status post right and left thyroid nodule biopsies on 08/07/2013-benign. 4. Hypothyroidism. 5. Hypertension.  6. History of hand-foot  syndrome secondary to Xeloda.   Disposition:Christine Shepherd remains in clinical remission from colon cancer. We will follow-up on the CEA from today. She will return for labs and surveillance CT scans in 6 months. We will schedule an office visit 2-3 days after the scans to review the results.  Her last colonoscopy was 09/12/2014. Report indicates repeat colonoscopy to be done at a one-year interval. We sent a message to [facility] gastroenterology to please contact the patient regarding the timing of her next colonoscopy.    Ned Card ANP/GNP-BC   02/05/2016  11:52 AM

## 2016-02-06 LAB — CEA: CEA: 1.4 ng/mL (ref 0.0–4.7)

## 2016-02-06 LAB — CEA (PARALLEL TESTING): CEA: <0.5 ng/mL

## 2016-02-09 ENCOUNTER — Telehealth: Payer: Self-pay | Admitting: *Deleted

## 2016-02-09 NOTE — Telephone Encounter (Signed)
Notified pt of normal CEA. She voiced understanding. 

## 2016-03-07 ENCOUNTER — Encounter: Payer: Self-pay | Admitting: Gastroenterology

## 2016-03-08 ENCOUNTER — Ambulatory Visit (AMBULATORY_SURGERY_CENTER): Payer: Self-pay

## 2016-03-08 VITALS — Ht 64.5 in | Wt 182.8 lb

## 2016-03-08 DIAGNOSIS — Z85038 Personal history of other malignant neoplasm of large intestine: Secondary | ICD-10-CM

## 2016-03-08 MED ORDER — SUPREP BOWEL PREP KIT 17.5-3.13-1.6 GM/177ML PO SOLN: 1.0000 | Freq: Once | ORAL | 0 refills | Status: AC

## 2016-03-08 NOTE — Progress Notes (Signed)
No allergies to eggs or soy No diet meds No home oxygen No past problems with anesthesia  Declined emmi 

## 2016-03-10 ENCOUNTER — Encounter: Payer: Self-pay | Admitting: Gastroenterology

## 2016-03-18 ENCOUNTER — Ambulatory Visit (AMBULATORY_SURGERY_CENTER): Payer: BLUE CROSS/BLUE SHIELD | Admitting: Gastroenterology

## 2016-03-18 ENCOUNTER — Encounter: Payer: Self-pay | Admitting: Gastroenterology

## 2016-03-18 VITALS — BP 122/70 | HR 66 | Temp 98.0°F | Resp 16 | Ht 64.5 in | Wt 182.0 lb

## 2016-03-18 DIAGNOSIS — Z85038 Personal history of other malignant neoplasm of large intestine: Secondary | ICD-10-CM | POA: Diagnosis not present

## 2016-03-18 MED ORDER — SODIUM CHLORIDE 0.9 % IV SOLN: 500.0000 mL | INTRAVENOUS | Status: DC

## 2016-03-18 NOTE — Progress Notes (Signed)
No egg or soy allergy known to patient   issues with past sedation with any surgeries  or procedures of post op nausea- no vomiting, no intubation problems  No diet pills per patient No home 02 use per patient  No blood thinners per patient  Pt denies issues with constipation  No A fib or A flutter

## 2016-03-18 NOTE — Op Note (Signed)
[place] Patient Name: Christine Shepherd Procedure Date: 03/18/2016 8:37 AM MRN: [id number] Endoscopist: [place]. Loletha Carrow , MD Age: 65 Referring MD:  Date of Birth: Oct 10, 1950 Gender: Female Account #: [id number] Procedure:                Colonoscopy Indications:              High risk colon cancer surveillance: Personal                            history of colon cancer, resected 2015. Colonoscopy                            09/2014 with hyperplastic polyp Medicines:                Monitored Anesthesia Care Procedure:                Pre-Anesthesia Assessment:                           - Prior to the procedure, a History and Physical                            was performed, and patient medications and                            allergies were reviewed. The patient's tolerance of                            previous anesthesia was also reviewed. The risks                            and benefits of the procedure and the sedation                            options and risks were discussed with the patient.                            All questions were answered, and informed consent                            was obtained. Prior Anticoagulants: The patient has                            taken no previous anticoagulant or antiplatelet                            agents. ASA Grade Assessment: II - A patient with                            mild systemic disease. After reviewing the risks                            and benefits, the patient was deemed in  satisfactory condition to undergo the procedure.                           After obtaining informed consent, the colonoscope                            was passed under direct vision. Throughout the                            procedure, the patient's blood pressure, pulse, and                            oxygen saturations were monitored continuously. The                            Model PCF-[id number])  scope was introduced                            through the anus and advanced to the the cecum,                            identified by appendiceal orifice and ileocecal                            valve. The colonoscopy was performed without                            difficulty. The patient tolerated the procedure                            well. The quality of the bowel preparation was                            excellent. The ileocecal valve, appendiceal                            orifice, and rectum were photographed. The quality                            of the bowel preparation was evaluated using the                            BBPS [facility] Bowel Preparation Scale) with scores                            of: Right Colon = 3, Transverse Colon = 3 and Left                            Colon = 3 (entire mucosa seen well with no residual                            staining, small fragments of stool or opaque  liquid). The total BBPS score equals 9. The bowel                            preparation used was SUPREP. Scope In: 9:00:01 AM Scope Out: 9:12:19 AM Scope Withdrawal Time: 0 hours 7 minutes 26 seconds  Total Procedure Duration: 0 hours 12 minutes 18 seconds  Findings:                 The perianal and digital rectal examinations were                            normal.                           A few diverticula were found in the descending                            colon and mid ascending colon.                           There was evidence of a prior end-to-end                            colo-colonic anastomosis in the proximal rectum.                            This was patent and was characterized by healthy                            appearing mucosa.                           Internal hemorrhoids were found during                            retroflexion. The hemorrhoids were Grade I                            (internal hemorrhoids that do not  prolapse).                           The exam was otherwise without abnormality. Complications:            No immediate complications. Estimated Blood Loss:     Estimated blood loss: none. Impression:               - Diverticulosis in the descending colon and in the                            mid ascending colon.                           - Patent end-to-end colo-colonic anastomosis,                            characterized by healthy appearing mucosa.                           -  Internal hemorrhoids.                           - The examination was otherwise normal.                           - No specimens collected. Recommendation:           - Patient has a contact number available for                            emergencies. The signs and symptoms of potential                            delayed complications were discussed with the                            patient. Return to normal activities tomorrow.                            Written discharge instructions were provided to the                            patient.                           - Resume previous diet.                           - Continue present medications.                           - Repeat colonoscopy in 3 years for surveillance. Henry L. Loletha Carrow, MD 03/18/2016 9:17:51 AM This report has been signed electronically.

## 2016-03-18 NOTE — Progress Notes (Signed)
A and O x3. Report to RN. Tolerated MAC anesthesia well. 

## 2016-03-18 NOTE — Patient Instructions (Signed)
YOU HAD AN ENDOSCOPIC PROCEDURE TODAY AT THE [facility]:   Refer to the procedure report that was given to you for any specific questions about what was found during the examination.  If the procedure report does not answer your questions, please call your gastroenterologist to clarify.  If you requested that your care partner not be given the details of your procedure findings, then the procedure report has been included in a sealed envelope for you to review at your convenience later.  YOU SHOULD EXPECT: Some feelings of bloating in the abdomen. Passage of more gas than usual.  Walking can help get rid of the air that was put into your GI tract during the procedure and reduce the bloating. If you had a lower endoscopy (such as a colonoscopy or flexible sigmoidoscopy) you may notice spotting of blood in your stool or on the toilet paper. If you underwent a bowel prep for your procedure, you may not have a normal bowel movement for a few days.  Please Note:  You might notice some irritation and congestion in your nose or some drainage.  This is from the oxygen used during your procedure.  There is no need for concern and it should clear up in a day or so.  SYMPTOMS TO REPORT IMMEDIATELY:   Following lower endoscopy (colonoscopy or flexible sigmoidoscopy):  Excessive amounts of blood in the stool  Significant tenderness or worsening of abdominal pains  Swelling of the abdomen that is new, acute  Fever of 100F or higher  For urgent or emergent issues, a gastroenterologist can be reached at any hour by calling [phone].   DIET:  We do recommend a small meal at first, but then you may proceed to your regular diet.  Drink plenty of fluids but you should avoid alcoholic beverages for 24 hours.  ACTIVITY:  You should plan to take it easy for the rest of today and you should NOT DRIVE or use heavy machinery until tomorrow (because of the sedation medicines used during the test).     FOLLOW UP: Our staff will call the number listed on your records the next business day following your procedure to check on you and address any questions or concerns that you may have regarding the information given to you following your procedure. If we do not reach you, we will leave a message.  However, if you are feeling well and you are not experiencing any problems, there is no need to return our call.  We will assume that you have returned to your regular daily activities without incident.  If any biopsies were taken you will be contacted by phone or by letter within the next 1-3 weeks.  Please call us at [phone] if you have not heard about the biopsies in 3 weeks.    SIGNATURES/CONFIDENTIALITY: You and/or your care partner have signed paperwork which will be entered into your electronic medical record.  These signatures attest to the fact that that the information above on your After Visit Summary has been reviewed and is understood.  Full responsibility of the confidentiality of this discharge information lies with you and/or your care-partner. 

## 2016-03-21 ENCOUNTER — Telehealth: Payer: Self-pay | Admitting: *Deleted

## 2016-03-21 NOTE — Telephone Encounter (Signed)
Left message on f/u call 

## 2016-04-11 ENCOUNTER — Other Ambulatory Visit: Payer: Self-pay | Admitting: Internal Medicine

## 2016-04-11 DIAGNOSIS — Z1231 Encounter for screening mammogram for malignant neoplasm of breast: Secondary | ICD-10-CM

## 2016-05-09 ENCOUNTER — Ambulatory Visit
Admission: RE | Admit: 2016-05-09 | Discharge: 2016-05-09 | Disposition: A | Discharge status: Home / Self Care | Payer: BLUE CROSS/BLUE SHIELD | Source: Ambulatory Visit | Attending: Internal Medicine | Admitting: Internal Medicine

## 2016-05-09 DIAGNOSIS — Z1231 Encounter for screening mammogram for malignant neoplasm of breast: Secondary | ICD-10-CM

## 2016-07-13 ENCOUNTER — Other Ambulatory Visit: Payer: Self-pay | Admitting: Dermatology

## 2016-08-05 ENCOUNTER — Other Ambulatory Visit (HOSPITAL_BASED_OUTPATIENT_CLINIC_OR_DEPARTMENT_OTHER): Payer: BLUE CROSS/BLUE SHIELD

## 2016-08-05 ENCOUNTER — Ambulatory Visit (HOSPITAL_COMMUNITY)
Admission: RE | Admit: 2016-08-05 | Discharge: 2016-08-05 | Disposition: A | Discharge status: Home / Self Care | Payer: BLUE CROSS/BLUE SHIELD | Source: Ambulatory Visit | Attending: Nurse Practitioner | Admitting: Nurse Practitioner

## 2016-08-05 DIAGNOSIS — I700 Atherosclerosis of aorta: Secondary | ICD-10-CM | POA: Diagnosis not present

## 2016-08-05 DIAGNOSIS — C187 Malignant neoplasm of sigmoid colon: Secondary | ICD-10-CM | POA: Diagnosis not present

## 2016-08-05 DIAGNOSIS — K439 Ventral hernia without obstruction or gangrene: Secondary | ICD-10-CM | POA: Diagnosis not present

## 2016-08-05 DIAGNOSIS — E042 Nontoxic multinodular goiter: Secondary | ICD-10-CM | POA: Insufficient documentation

## 2016-08-05 LAB — COMPREHENSIVE METABOLIC PANEL
ALT: 15 U/L (ref 0–55)
AST: 14 U/L (ref 5–34)
Albumin: 3.9 g/dL (ref 3.5–5.0)
Alkaline Phosphatase: 54 U/L (ref 40–150)
Anion Gap: 11 mEq/L (ref 3–11)
BUN: 11.5 mg/dL (ref 7.0–26.0)
CALCIUM: 9.8 mg/dL (ref 8.4–10.4)
CHLORIDE: 105 meq/L (ref 98–109)
CO2: 24 meq/L (ref 22–29)
CREATININE: 1 mg/dL (ref 0.6–1.1)
EGFR: 66 mL/min/{1.73_m2} — ABNORMAL LOW (ref >90)
GLUCOSE: 137 mg/dL (ref 70–140)
POTASSIUM: 3.4 meq/L — AB (ref 3.5–5.1)
SODIUM: 140 meq/L (ref 136–145)
Total Bilirubin: 0.61 mg/dL (ref 0.20–1.20)
Total Protein: 7.2 g/dL (ref 6.4–8.3)

## 2016-08-05 LAB — CBC WITH DIFFERENTIAL/PLATELET
BASO%: 0.2 % (ref 0.0–2.0)
BASOS ABS: 0 10*3/uL (ref 0.0–0.1)
EOS%: 1.9 % (ref 0.0–7.0)
Eosinophils Absolute: 0.1 10*3/uL (ref 0.0–0.5)
HCT: 40.2 % (ref 34.8–46.6)
HGB: 13.7 g/dL (ref 11.6–15.9)
LYMPH%: 39.5 % (ref 14.0–49.7)
MCH: 30.5 pg (ref 25.1–34.0)
MCHC: 34.1 g/dL (ref 31.5–36.0)
MCV: 89.5 fL (ref 79.5–101.0)
MONO#: 0.3 10*3/uL (ref 0.1–0.9)
MONO%: 6.2 % (ref 0.0–14.0)
NEUT#: 2.7 10*3/uL (ref 1.5–6.5)
NEUT%: 52.2 % (ref 38.4–76.8)
PLATELETS: 209 10*3/uL (ref 145–400)
RBC: 4.49 10*6/uL (ref 3.70–5.45)
RDW: 13.3 % (ref 11.2–14.5)
WBC: 5.1 10*3/uL (ref 3.9–10.3)
lymph#: 2 10*3/uL (ref 0.9–3.3)

## 2016-08-05 LAB — CEA (IN HOUSE-CHCC): CEA (CHCC-IN HOUSE): 1.36 ng/mL (ref 0.00–5.00)

## 2016-08-05 MED ORDER — IOPAMIDOL (ISOVUE-300) INJECTION 61%
100.0000 mL | Freq: Once | INTRAVENOUS | Status: AC | PRN
  Administered 2016-08-05: 100 mL via INTRAVENOUS

## 2016-08-05 MED ORDER — IOPAMIDOL (ISOVUE-300) INJECTION 61%
INTRAVENOUS | Status: AC
  Filled 2016-08-05: qty 100

## 2016-08-06 LAB — CEA: CEA: 1.4 ng/mL (ref 0.0–4.7)

## 2016-08-09 ENCOUNTER — Ambulatory Visit (HOSPITAL_BASED_OUTPATIENT_CLINIC_OR_DEPARTMENT_OTHER): Payer: BLUE CROSS/BLUE SHIELD | Admitting: Oncology

## 2016-08-09 ENCOUNTER — Telehealth: Payer: Self-pay | Admitting: Oncology

## 2016-08-09 VITALS — BP 164/59 | HR 81 | Temp 98.2°F | Resp 20 | Ht 64.5 in | Wt 181.9 lb

## 2016-08-09 DIAGNOSIS — I10 Essential (primary) hypertension: Secondary | ICD-10-CM | POA: Diagnosis not present

## 2016-08-09 DIAGNOSIS — C187 Malignant neoplasm of sigmoid colon: Secondary | ICD-10-CM | POA: Diagnosis not present

## 2016-08-09 NOTE — Progress Notes (Signed)
[place] OFFICE PROGRESS NOTE   Diagnosis: Colon cancer  INTERVAL HISTORY:   Ms. Paradise returns as scheduled. She feels well. Good appetite. No difficulty with bowel function. No complaint.  Objective:  Vital signs in last 24 hours:  There were no vitals taken for this visit.    HEENT: Bilateral thyroid enlargement. Lymphatics: No cervical, supraclavicular, axillary, or inguinal nodes Resp: Lungs clear bilaterally Cardio: Regular rate and rhythm GI: No hepatosplenomegaly, no mass Vascular: No leg edema   Lab Results:  Lab Results  Component Value Date   WBC 5.1 08/05/2016   HGB 13.7 08/05/2016   HCT 40.2 08/05/2016   MCV 89.5 08/05/2016   PLT 209 08/05/2016   NEUTROABS 2.7 08/05/2016   CEA-1.36   Imaging:  Ct Chest W Contrast  Result Date: 08/05/2016 CLINICAL DATA:  Followup stage III sigmoid colon carcinoma. Previous surgery and chemotherapy. EXAM: CT CHEST, ABDOMEN, AND PELVIS WITH CONTRAST TECHNIQUE: Multidetector CT imaging of the chest, abdomen and pelvis was performed following the standard protocol during bolus administration of intravenous contrast. CONTRAST:  153m ISOVUE-300 IOPAMIDOL (ISOVUE-300) INJECTION 61% COMPARISON:  08/03/2015 FINDINGS: CT CHEST FINDINGS Cardiovascular: No acute findings. Mediastinum/Lymph Nodes: No pathologically enlarged lymph nodes identified. Stable multinodular goiter with substernal extension. Lungs/Pleura: No pulmonary infiltrate or mass identified. No effusion present. Musculoskeletal:  No suspicious bone lesions identified. CT ABDOMEN AND PELVIS FINDINGS Hepatobiliary: No masses identified. Prior cholecystectomy noted. No evidence of biliary dilatation. Pancreas:  No mass or inflammatory changes. Spleen:  Within normal limits in size and appearance. Adrenals/Urinary tract:  No masses or hydronephrosis. Stomach/Bowel: No evidence of obstruction, inflammatory process, or abnormal fluid collections. Sigmoid colon  surgical anastomosis again seen, without evidence of recurrent mass. Vascular/Lymphatic: No pathologically enlarged lymph nodes identified. No abdominal aortic aneurysm. Aortic atherosclerosis. Reproductive: Prior hysterectomy noted. Adnexal regions are unremarkable in appearance. Other: Stable small supraumbilical ventral hernia containing only fat. Musculoskeletal:  No suspicious bone lesions identified. IMPRESSION: No evidence of recurrent or metastatic carcinoma. No other acute findings. Stable multinodular goiter with substernal extension. Stable small ventral hernia containing only fat. Aortic atherosclerosis. Electronically Signed   By: JEarle GellM.D.   On: 08/05/2016 12:26   Ct Abdomen Pelvis W Contrast  Result Date: 08/05/2016 CLINICAL DATA:  Followup stage III sigmoid colon carcinoma. Previous surgery and chemotherapy. EXAM: CT CHEST, ABDOMEN, AND PELVIS WITH CONTRAST TECHNIQUE: Multidetector CT imaging of the chest, abdomen and pelvis was performed following the standard protocol during bolus administration of intravenous contrast. CONTRAST:  1068mISOVUE-300 IOPAMIDOL (ISOVUE-300) INJECTION 61% COMPARISON:  08/03/2015 FINDINGS: CT CHEST FINDINGS Cardiovascular: No acute findings. Mediastinum/Lymph Nodes: No pathologically enlarged lymph nodes identified. Stable multinodular goiter with substernal extension. Lungs/Pleura: No pulmonary infiltrate or mass identified. No effusion present. Musculoskeletal:  No suspicious bone lesions identified. CT ABDOMEN AND PELVIS FINDINGS Hepatobiliary: No masses identified. Prior cholecystectomy noted. No evidence of biliary dilatation. Pancreas:  No mass or inflammatory changes. Spleen:  Within normal limits in size and appearance. Adrenals/Urinary tract:  No masses or hydronephrosis. Stomach/Bowel: No evidence of obstruction, inflammatory process, or abnormal fluid collections. Sigmoid colon surgical anastomosis again seen, without evidence of recurrent mass.  Vascular/Lymphatic: No pathologically enlarged lymph nodes identified. No abdominal aortic aneurysm. Aortic atherosclerosis. Reproductive: Prior hysterectomy noted. Adnexal regions are unremarkable in appearance. Other: Stable small supraumbilical ventral hernia containing only fat. Musculoskeletal:  No suspicious bone lesions identified. IMPRESSION: No evidence of recurrent or metastatic carcinoma. No other acute findings. Stable multinodular goiter with substernal  extension. Stable small ventral hernia containing only fat. Aortic atherosclerosis. Electronically Signed   By: Earle Gell M.D.   On: 08/05/2016 12:26    Medications: I have reviewed the patient's current medications.  Assessment/Plan: 1. 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   No loss of mismatch repair protein expression  Initiation of adjuvant Xeloda 09/14/2013. Cycle 8 completed beginning 02/16/2014  CT scans chest/abdomen/pelvis 07/21/2014 with no evidence of recurrent or metastatic disease. Thyroid goiter noted.  Colonoscopy 09/12/2014, height plastic polyp removed from the sigmoid colon  CTs of the chest, abdomen, and pelvis 08/03/2015-negative for metastatic disease  Colonoscopy 03/18/2016-no polyps  CTs 08/05/2016-negative for metastatic disease 2. History of multiple colon polyps. 3. Thyromegaly with multiple thyroid nodules-status post right and left thyroid nodule biopsies on 08/07/2013-benign. 4. Hypothyroidism. 5. Hypertension.  6. History of hand-foot syndrome secondary to Xeloda.     Disposition:  Ms. Perkin remains in clinical remission from colon cancer. She will return for an office visit and CEA in 6 months. She has completed 3 yearly surveillance CT scans and will not be scheduled for additional CT imaging.  15 minutes were spent with the patient today. The majority of the time was  used for counseling" of care.  Betsy Coder, MD  08/09/2016  8:38 AM

## 2016-08-09 NOTE — Telephone Encounter (Signed)
Appointments scheduled per 1/30 LOS. Patient given AVS report and calendars with future scheduled appointments. °

## 2017-02-06 ENCOUNTER — Ambulatory Visit (HOSPITAL_BASED_OUTPATIENT_CLINIC_OR_DEPARTMENT_OTHER): Payer: BLUE CROSS/BLUE SHIELD | Admitting: Oncology

## 2017-02-06 ENCOUNTER — Other Ambulatory Visit (HOSPITAL_BASED_OUTPATIENT_CLINIC_OR_DEPARTMENT_OTHER): Payer: BLUE CROSS/BLUE SHIELD

## 2017-02-06 ENCOUNTER — Telehealth: Payer: Self-pay | Admitting: Oncology

## 2017-02-06 VITALS — BP 151/71 | HR 69 | Temp 98.7°F | Resp 18 | Ht 64.5 in | Wt 177.1 lb

## 2017-02-06 DIAGNOSIS — C187 Malignant neoplasm of sigmoid colon: Secondary | ICD-10-CM | POA: Diagnosis not present

## 2017-02-06 DIAGNOSIS — I10 Essential (primary) hypertension: Secondary | ICD-10-CM | POA: Diagnosis not present

## 2017-02-06 LAB — CEA (IN HOUSE-CHCC): CEA (CHCC-IN HOUSE): 1.18 ng/mL (ref 0.00–5.00)

## 2017-02-06 NOTE — Progress Notes (Signed)
  [place] OFFICE PROGRESS NOTE   Diagnosis: Colon cancer  INTERVAL HISTORY:   Christine Shepherd returns as scheduled per she feels well. Good appetite. No difficulty with bowel function. No complaint.  Objective:  Vital signs in last 24 hours:  Blood pressure (!) 151/71, pulse 69, temperature 98.7 F (37.1 C), temperature source Oral, resp. rate 18, height 5' 4.5" (1.638 m), weight 177 lb 1.6 oz (80.3 kg), SpO2 100 %.    HEENT: Neck without mass on the thyroid is enlarged Lymphatics: No cervical, supraclavicular, axillary, or inguinal nodes Resp: Lungs clear bilaterally Cardio: Regular rate and rhythm GI: No hepatomegaly, no mass, nontender Vascular: No leg edema   Lab Results:    Lab Results  Component Value Date   CEA1 1.36 08/05/2016   CEA1 1.4 08/05/2016     Medications: I have reviewed the patient's current medications.  Assessment/Plan: 1. 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   No loss of mismatch repair protein expression  Initiation of adjuvant Xeloda 09/14/2013. Cycle 8 completed beginning 02/16/2014  CT scans chest/abdomen/pelvis 07/21/2014 with no evidence of recurrent or metastatic disease. Thyroid goiter noted.  Colonoscopy 09/12/2014, height plastic polyp removed from the sigmoid colon  CTs of the chest, abdomen, and pelvis 08/03/2015-negative for metastatic disease  Colonoscopy 03/18/2016-no polyps  CTs 08/05/2016-negative for metastatic disease 2. History of multiple colon polyps. 3. Thyromegaly with multiple thyroid nodules-status post right and left thyroid nodule biopsies on 08/07/2013-benign. 4. Hypothyroidism. 5. Hypertension.  6. History of hand-foot syndrome secondary to Xeloda.    Disposition:  Christine Shepherd remains in clinical remission from colon cancer. We will follow-up on the CEA from today. She  will return for an office visit and CEA in 6 months.  15 minutes were spent with the patient today. The majority of the time was used for counseling and coordination of care.  Donneta Romberg, MD  02/06/2017  3:12 PM

## 2017-02-06 NOTE — Telephone Encounter (Signed)
Gave patient avs report and appointments for January  °

## 2017-02-07 ENCOUNTER — Telehealth: Payer: Self-pay | Admitting: *Deleted

## 2017-02-07 LAB — CEA: CEA: 1.6 ng/mL (ref 0.0–4.7)

## 2017-02-07 NOTE — Telephone Encounter (Signed)
Telephone call to patient advising lab results as directed below. Patient verbalized an understanding and will call this office with any concerns or questions.

## 2017-02-07 NOTE — Telephone Encounter (Signed)
-----   Message from [name], MD sent at 02/07/2017  7:28 AM EDT ----- Please call patient, Christine Shepherd is normal, f/u as scheduled

## 2017-04-12 ENCOUNTER — Other Ambulatory Visit: Payer: Self-pay | Admitting: Internal Medicine

## 2017-04-12 DIAGNOSIS — Z1239 Encounter for other screening for malignant neoplasm of breast: Secondary | ICD-10-CM

## 2017-05-15 ENCOUNTER — Ambulatory Visit
Admission: RE | Admit: 2017-05-15 | Discharge: 2017-05-15 | Disposition: A | Discharge status: Home / Self Care | Payer: BLUE CROSS/BLUE SHIELD | Source: Ambulatory Visit | Attending: Internal Medicine | Admitting: Internal Medicine

## 2017-05-15 DIAGNOSIS — Z1239 Encounter for other screening for malignant neoplasm of breast: Secondary | ICD-10-CM

## 2017-06-15 ENCOUNTER — Telehealth: Payer: Self-pay | Admitting: Nurse Practitioner

## 2017-06-15 NOTE — Telephone Encounter (Signed)
Spoke with patient and rescheduled appt due to her insurance changing.

## 2017-08-09 ENCOUNTER — Other Ambulatory Visit: Payer: BLUE CROSS/BLUE SHIELD

## 2017-08-09 ENCOUNTER — Ambulatory Visit: Payer: BLUE CROSS/BLUE SHIELD | Admitting: Nurse Practitioner

## 2017-08-23 ENCOUNTER — Telehealth: Payer: Self-pay

## 2017-08-23 ENCOUNTER — Inpatient Hospital Stay: Payer: BLUE CROSS/BLUE SHIELD | Admitting: Nurse Practitioner

## 2017-08-23 ENCOUNTER — Encounter: Payer: Self-pay | Admitting: Nurse Practitioner

## 2017-08-23 ENCOUNTER — Inpatient Hospital Stay: Payer: BLUE CROSS/BLUE SHIELD | Attending: Oncology

## 2017-08-23 VITALS — BP 135/66 | HR 67 | Temp 98.4°F | Resp 18 | Ht 64.5 in | Wt 180.4 lb

## 2017-08-23 DIAGNOSIS — E039 Hypothyroidism, unspecified: Secondary | ICD-10-CM | POA: Insufficient documentation

## 2017-08-23 DIAGNOSIS — C187 Malignant neoplasm of sigmoid colon: Secondary | ICD-10-CM

## 2017-08-23 DIAGNOSIS — I10 Essential (primary) hypertension: Secondary | ICD-10-CM | POA: Insufficient documentation

## 2017-08-23 LAB — CEA (IN HOUSE-CHCC)

## 2017-08-23 NOTE — Telephone Encounter (Signed)
Printed avs and calender for upcoming appointment. per 2/13 los

## 2017-08-23 NOTE — Progress Notes (Signed)
  [place] OFFICE PROGRESS NOTE   Diagnosis: Colon cancer  INTERVAL HISTORY:   Christine Shepherd returns as scheduled.  She feels well.  No change in bowel habits.  No rectal bleeding.  She denies abdominal pain.  She has a good appetite.  She is exercising.  Objective:  Vital signs in last 24 hours:  There were no vitals taken for this visit.    HEENT: Neck without mass.  Thyroid is enlarged. Lymphatics: No palpable cervical, supraclavicular, axillary or inguinal lymph nodes. Resp: Lungs clear bilaterally. Cardio: Regular rate and rhythm. GI: Abdomen soft and nontender.  No hepatomegaly.  No mass. Vascular: No leg edema.   Lab Results:  Lab Results  Component Value Date   WBC 5.1 08/05/2016   HGB 13.7 08/05/2016   HCT 40.2 08/05/2016   MCV 89.5 08/05/2016   PLT 209 08/05/2016   NEUTROABS 2.7 08/05/2016    Imaging:  No results found.  Medications: I have reviewed the patient's current medications.  Assessment/Plan: 1. 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   No loss of mismatch repair protein expression  Initiation of adjuvant Xeloda 09/14/2013. Cycle 8 completed beginning 02/16/2014  CT scans chest/abdomen/pelvis 07/21/2014 with no evidence of recurrent or metastatic disease. Thyroid goiter noted.  Colonoscopy 09/12/2014, height plastic polyp removed from the sigmoid colon  CTs of the chest, abdomen, and pelvis 08/03/2015-negative for metastatic disease  Colonoscopy 03/18/2016-no polyps  CTs 08/05/2016-negative for metastatic disease 2. History of multiple colon polyps. 3. Thyromegaly with multiple thyroid nodules-status post right and left thyroid nodule biopsies on 08/07/2013-benign. 4. Hypothyroidism. 5. Hypertension.  6. History of hand-foot syndrome secondary to Xeloda.     Disposition: Christine Shepherd remains in clinical  remission from colon cancer.  We will follow-up on the CEA from today.  She will return for a follow-up visit and CEA in 6 months.  She will contact the office in the interim with any problems.    Ned Card ANP/GNP-BC   08/23/2017  10:19 AM

## 2017-08-24 ENCOUNTER — Telehealth: Payer: Self-pay

## 2017-08-24 NOTE — Telephone Encounter (Signed)
Pt called regarding lab work from yesterday. Informed pt that CEA is normal and to follow up as scheduled. Voiced understanding.

## 2018-01-15 ENCOUNTER — Other Ambulatory Visit: Payer: Self-pay | Admitting: Internal Medicine

## 2018-01-15 ENCOUNTER — Ambulatory Visit
Admission: RE | Admit: 2018-01-15 | Discharge: 2018-01-15 | Disposition: A | Discharge status: Home / Self Care | Payer: BLUE CROSS/BLUE SHIELD | Source: Ambulatory Visit | Attending: Internal Medicine | Admitting: Internal Medicine

## 2018-01-15 DIAGNOSIS — R509 Fever, unspecified: Secondary | ICD-10-CM

## 2018-01-15 MED ORDER — IOPAMIDOL (ISOVUE-300) INJECTION 61%
75.0000 mL | Freq: Once | INTRAVENOUS | Status: AC | PRN
  Administered 2018-01-15: 75 mL via INTRAVENOUS

## 2018-02-22 ENCOUNTER — Inpatient Hospital Stay: Payer: Medicare Other | Attending: Oncology | Admitting: Oncology

## 2018-02-22 ENCOUNTER — Telehealth: Payer: Self-pay

## 2018-02-22 ENCOUNTER — Telehealth: Payer: Self-pay | Admitting: Oncology

## 2018-02-22 ENCOUNTER — Inpatient Hospital Stay: Payer: Medicare Other

## 2018-02-22 VITALS — BP 152/70 | HR 90 | Temp 98.8°F | Resp 19 | Ht 64.5 in | Wt 181.0 lb

## 2018-02-22 DIAGNOSIS — I10 Essential (primary) hypertension: Secondary | ICD-10-CM

## 2018-02-22 DIAGNOSIS — C187 Malignant neoplasm of sigmoid colon: Secondary | ICD-10-CM | POA: Insufficient documentation

## 2018-02-22 DIAGNOSIS — E039 Hypothyroidism, unspecified: Secondary | ICD-10-CM | POA: Diagnosis not present

## 2018-02-22 LAB — CEA (IN HOUSE-CHCC): CEA (CHCC-In House): 1.39 ng/mL (ref 0.00–5.00)

## 2018-02-22 NOTE — Telephone Encounter (Addendum)
Pt voiced understanding of message below  ----- Message from [name], MD sent at 02/22/2018  1:09 PM EDT ----- Please call patient, CEA is normal

## 2018-02-22 NOTE — Progress Notes (Signed)
  [place] OFFICE PROGRESS NOTE   Diagnosis: Colon cancer  INTERVAL HISTORY:   Ms. Warbington returns as scheduled.  She feels well.  Good appetite and energy level.  She reports being referred to ENT for evaluation of thyroid enlargement.  Objective:  Vital signs in last 24 hours:  Blood pressure (!) 152/70, pulse 90, temperature 98.8 F (37.1 C), temperature source Oral, resp. rate 19, height 5' 4.5" (1.638 m), weight 181 lb (82.1 kg), SpO2 100 %.    HEENT: Neck soft fullness in the area of the right thyroid Lymphatics: No cervical, supraclavicular, axillary, or inguinal nodes Resp: Lungs clear bilaterally Cardio: Regular rate and rhythm GI: No hepatomegaly, no mass, nontender Vascular: No leg edema   Lab Results:   Lab Results  Component Value Date   CEA1 1.39 02/22/2018     Medications: I have reviewed the patient's current medications.   Assessment/Plan: 1. 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   No loss of mismatch repair protein expression  Initiation of adjuvant Xeloda 09/14/2013. Cycle 8 completed beginning 02/16/2014  CT scans chest/abdomen/pelvis 07/21/2014 with no evidence of recurrent or metastatic disease. Thyroid goiter noted.  Colonoscopy 09/12/2014, height plastic polyp removed from the sigmoid colon  CTs of the chest, abdomen, and pelvis 08/03/2015-negative for metastatic disease  Colonoscopy 03/18/2016-no polyps  CTs 08/05/2016-negative for metastatic disease 2. History of multiple colon polyps. 3. Thyromegaly with multiple thyroid nodules-status post right and left thyroid nodule biopsies on 08/07/2013-benign. 4. Hypothyroidism. 5. Hypertension.  6. History of hand-foot syndrome secondary to Xeloda.     Disposition: Christine Shepherd remains in clinical remission from colon cancer.  She will return for an  office visit and CEA in 6 months.  She will continue surveillance colonoscopy with Dr. Loletha Carrow.  15 minutes were spent with the patient today.  The majority of the time was used for counseling and coordination of care.  Betsy Coder, MD  02/22/2018  11:39 AM

## 2018-02-22 NOTE — Telephone Encounter (Signed)
Scheduled appt per 8/15 los - gave patient aVS and calender per los.  

## 2018-05-14 ENCOUNTER — Other Ambulatory Visit: Payer: Self-pay | Admitting: Internal Medicine

## 2018-05-14 DIAGNOSIS — Z1231 Encounter for screening mammogram for malignant neoplasm of breast: Secondary | ICD-10-CM

## 2018-05-18 ENCOUNTER — Ambulatory Visit
Admission: RE | Admit: 2018-05-18 | Discharge: 2018-05-18 | Disposition: A | Discharge status: Home / Self Care | Payer: Medicare Other | Source: Ambulatory Visit | Attending: Internal Medicine | Admitting: Internal Medicine

## 2018-05-18 DIAGNOSIS — Z1231 Encounter for screening mammogram for malignant neoplasm of breast: Secondary | ICD-10-CM

## 2018-08-24 ENCOUNTER — Inpatient Hospital Stay: Payer: Medicare Other

## 2018-08-24 ENCOUNTER — Inpatient Hospital Stay: Payer: Medicare Other | Attending: Oncology | Admitting: Oncology

## 2018-08-24 VITALS — BP 157/85 | HR 85 | Temp 98.9°F | Resp 18 | Wt 176.0 lb

## 2018-08-24 DIAGNOSIS — C187 Malignant neoplasm of sigmoid colon: Secondary | ICD-10-CM

## 2018-08-24 DIAGNOSIS — I10 Essential (primary) hypertension: Secondary | ICD-10-CM | POA: Insufficient documentation

## 2018-08-24 DIAGNOSIS — Z85038 Personal history of other malignant neoplasm of large intestine: Secondary | ICD-10-CM | POA: Insufficient documentation

## 2018-08-24 LAB — CEA (IN HOUSE-CHCC): CEA (CHCC-IN HOUSE): 1.82 ng/mL (ref 0.00–5.00)

## 2018-08-24 NOTE — Progress Notes (Signed)
  [place] OFFICE PROGRESS NOTE   Diagnosis: Colon cancer  INTERVAL HISTORY:   [name] returns as scheduled.  She feels well.  No difficulty with bowel function.  Good appetite.  She has noted a nodule at the dorsum of the left hand.  Objective:  Vital signs in last 24 hours:  Blood pressure (!) 157/85, pulse 85, temperature 98.9 F (37.2 C), temperature source Oral, resp. rate 18, weight 176 lb (79.8 kg), SpO2 99 %.    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   Assessment/Plan: 1. 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   No loss of mismatch repair protein expression  Initiation of adjuvant Xeloda 09/14/2013. Cycle 8 completed beginning 02/16/2014  CT scans chest/abdomen/pelvis 07/21/2014 with no evidence of recurrent or metastatic disease. Thyroid goiter noted.  Colonoscopy 09/12/2014, height plastic polyp removed from the sigmoid colon  CTs of the chest, abdomen, and pelvis 08/03/2015-negative for metastatic disease  Colonoscopy 03/18/2016-no polyps  CTs 08/05/2016-negative for metastatic disease 2. History of multiple colon polyps. 3. Thyromegaly with multiple thyroid nodules-status post right and left thyroid nodule biopsies on 08/07/2013-benign. 4. Hypothyroidism. 5. Hypertension.  6. History of hand-foot syndrome secondary to Xeloda.   Disposition: Christine Shepherd is in clinical remission from colon  cancer.  She has a good prognosis for a long-term disease-free survival.  She will continue clinical follow-up with Dr. Nyoka Cowden.  She will be due for a surveillance colonoscopy with Dr. Loletha Carrow later this year.  She appears to have a tendon sheath cyst at the dorsum of the left hand.  She plans to see Dr. Nyoka Cowden to evaluate this.  Ms. Christine Shepherd was discharged from the medical oncology clinic today.  I am available to see her in the future as needed.  Betsy Coder, MD  08/24/2018  3:10 PM

## 2019-03-02 ENCOUNTER — Encounter: Payer: Self-pay | Admitting: Gastroenterology

## 2019-04-05 ENCOUNTER — Other Ambulatory Visit: Payer: Self-pay

## 2019-04-05 ENCOUNTER — Ambulatory Visit (AMBULATORY_SURGERY_CENTER): Payer: Self-pay | Admitting: *Deleted

## 2019-04-05 ENCOUNTER — Encounter: Payer: Self-pay | Admitting: Gastroenterology

## 2019-04-05 VITALS — Temp 96.9°F | Ht 64.5 in | Wt 170.0 lb

## 2019-04-05 DIAGNOSIS — Z85038 Personal history of other malignant neoplasm of large intestine: Secondary | ICD-10-CM

## 2019-04-05 MED ORDER — NA SULFATE-K SULFATE-MG SULF 17.5-3.13-1.6 GM/177ML PO SOLN: ORAL | 0 refills | Status: DC

## 2019-04-05 NOTE — Progress Notes (Signed)
Patient is here in-person for PV. Patient denies any allergies to eggs or soy. Patient denies any problems with anesthesia/sedation. Patient denies any oxygen use at home. Patient denies taking any diet/weight loss medications or blood thinners. Patient is not being treated for MRSA or C-diff. EMMI education assisgned to patient on colonoscopy, this was explained and instructions given to patient.   Pt is aware that care partner will wait in the car during procedure; if they feel like they will be too hot to wait in the car; they may wait in the lobby. Patient is aware to bring only one care partner. We want them to wear a mask (we do not have any that we can provide them), practice social distancing, and we will check their temperatures when they get here.  I did remind patient that their care partner needs to stay in the parking lot the entire time. Pt will wear mask into building.

## 2019-04-18 ENCOUNTER — Telehealth: Payer: Self-pay

## 2019-04-18 NOTE — Telephone Encounter (Signed)
Covid-19 screening questions   Do you now or have you had a fever in the last 14 days?  Do you have any respiratory symptoms of shortness of breath or cough now or in the last 14 days?  Do you have any family members or close contacts with diagnosed or suspected Covid-19 in the past 14 days?  Have you been tested for Covid-19 and found to be positive?       

## 2019-04-18 NOTE — Telephone Encounter (Signed)
Pt returned call and answered “No” to all questions.  ° °

## 2019-04-19 ENCOUNTER — Encounter: Payer: Self-pay | Admitting: Gastroenterology

## 2019-04-19 ENCOUNTER — Ambulatory Visit (AMBULATORY_SURGERY_CENTER): Payer: Medicare Other | Admitting: Gastroenterology

## 2019-04-19 ENCOUNTER — Other Ambulatory Visit: Payer: Self-pay | Admitting: Gastroenterology

## 2019-04-19 ENCOUNTER — Other Ambulatory Visit: Payer: Self-pay

## 2019-04-19 VITALS — BP 132/71 | HR 66 | Temp 98.5°F | Resp 12 | Ht 64.5 in | Wt 170.0 lb

## 2019-04-19 DIAGNOSIS — D123 Benign neoplasm of transverse colon: Secondary | ICD-10-CM

## 2019-04-19 DIAGNOSIS — Z85038 Personal history of other malignant neoplasm of large intestine: Secondary | ICD-10-CM

## 2019-04-19 DIAGNOSIS — K635 Polyp of colon: Secondary | ICD-10-CM | POA: Diagnosis not present

## 2019-04-19 MED ORDER — SODIUM CHLORIDE 0.9 % IV SOLN: 500.0000 mL | Freq: Once | INTRAVENOUS | Status: DC

## 2019-04-19 NOTE — Progress Notes (Signed)
Called to room to assist during endoscopic procedure.  Patient ID and intended procedure confirmed with present staff. Received instructions for my participation in the procedure from the performing physician.  

## 2019-04-19 NOTE — Progress Notes (Signed)
To PACU, VSS. Report to [email or web address] 

## 2019-04-19 NOTE — Patient Instructions (Signed)
Discharge instructions given. Handouts on polyps and diverticulosis. Resume previous medications. YOU HAD AN ENDOSCOPIC PROCEDURE TODAY AT THE [facility]:   Refer to the procedure report that was given to you for any specific questions about what was found during the examination.  If the procedure report does not answer your questions, please call your gastroenterologist to clarify.  If you requested that your care partner not be given the details of your procedure findings, then the procedure report has been included in a sealed envelope for you to review at your convenience later.  YOU SHOULD EXPECT: Some feelings of bloating in the abdomen. Passage of more gas than usual.  Walking can help get rid of the air that was put into your GI tract during the procedure and reduce the bloating. If you had a lower endoscopy (such as a colonoscopy or flexible sigmoidoscopy) you may notice spotting of blood in your stool or on the toilet paper. If you underwent a bowel prep for your procedure, you may not have a normal bowel movement for a few days.  Please Note:  You might notice some irritation and congestion in your nose or some drainage.  This is from the oxygen used during your procedure.  There is no need for concern and it should clear up in a day or so.  SYMPTOMS TO REPORT IMMEDIATELY:   Following lower endoscopy (colonoscopy or flexible sigmoidoscopy):  Excessive amounts of blood in the stool  Significant tenderness or worsening of abdominal pains  Swelling of the abdomen that is new, acute  Fever of 100F or higher   For urgent or emergent issues, a gastroenterologist can be reached at any hour by calling [phone].   DIET:  We do recommend a small meal at first, but then you may proceed to your regular diet.  Drink plenty of fluids but you should avoid alcoholic beverages for 24 hours.  ACTIVITY:  You should plan to take it easy for the rest of today and you should NOT  DRIVE or use heavy machinery until tomorrow (because of the sedation medicines used during the test).    FOLLOW UP: Our staff will call the number listed on your records 48-72 hours following your procedure to check on you and address any questions or concerns that you may have regarding the information given to you following your procedure. If we do not reach you, we will leave a message.  We will attempt to reach you two times.  During this call, we will ask if you have developed any symptoms of COVID 19. If you develop any symptoms (ie: fever, flu-like symptoms, shortness of breath, cough etc.) before then, please call [phone].  If you test positive for Covid 19 in the 2 weeks post procedure, please call and report this information to us.    If any biopsies were taken you will be contacted by phone or by letter within the next 1-3 weeks.  Please call us at [phone] if you have not heard about the biopsies in 3 weeks.    SIGNATURES/CONFIDENTIALITY: You and/or your care partner have signed paperwork which will be entered into your electronic medical record.  These signatures attest to the fact that that the information above on your After Visit Summary has been reviewed and is understood.  Full responsibility of the confidentiality of this discharge information lies with you and/or your care-partner. 

## 2019-04-19 NOTE — Op Note (Signed)
[place] Patient Name: Christine Shepherd Procedure Date: 04/19/2019 8:58 AM MRN: [id number] Endoscopist: [place]. Loletha Carrow , MD Age: 68 Referring MD:  Date of Birth: March 01, 1951 Gender: Female Account #: [id number] Procedure:                Colonoscopy Indications:              High risk colon cancer surveillance: Personal                            history of colon cancer ( resection partial left                            colon 2015; no polyps 03/2016) Medicines:                Monitored Anesthesia Care Procedure:                Pre-Anesthesia Assessment:                           - Prior to the procedure, a History and Physical                            was performed, and patient medications and                            allergies were reviewed. The patient's tolerance of                            previous anesthesia was also reviewed. The risks                            and benefits of the procedure and the sedation                            options and risks were discussed with the patient.                            All questions were answered, and informed consent                            was obtained. Prior Anticoagulants: The patient has                            taken no previous anticoagulant or antiplatelet                            agents. ASA Grade Assessment: II - A patient with                            mild systemic disease. After reviewing the risks                            and benefits, the patient was deemed in  satisfactory condition to undergo the procedure.                           After obtaining informed consent, the colonoscope                            was passed under direct vision. Throughout the                            procedure, the patient's blood pressure, pulse, and                            oxygen saturations were monitored continuously. The                            Colonoscope was introduced through  the anus and                            advanced to the the cecum, identified by                            appendiceal orifice and ileocecal valve. The                            colonoscopy was somewhat difficult due to a                            redundant colon and significant looping (partially                            related to use of prediatric colonoscope).                            Successful completion of the procedure was aided by                            applying abdominal pressure. The quality of the                            bowel preparation was good. The ileocecal valve,                            appendiceal orifice, and rectum were photographed. Scope In: 9:00:51 AM Scope Out: 9:20:44 AM Scope Withdrawal Time: 0 hours 8 minutes 52 seconds  Total Procedure Duration: 0 hours 19 minutes 53 seconds  Findings:                 The perianal and digital rectal examinations were                            normal.                           A single diverticulum was found in the right colon.  A diminutive polyp was found in the transverse                            colon. The polyp was sessile. The polyp was removed                            with a cold biopsy forceps. Resection and retrieval                            were complete.                           There was evidence of a prior end-to-side                            colo-rectal anastomosis in the proximal rectum.                            This was patent and was characterized by healthy                            appearing mucosa.                           The exam was otherwise without abnormality on                            direct and retroflexion views. Complications:            No immediate complications. Estimated Blood Loss:     Estimated blood loss was minimal. Impression:               - Diverticulosis in the right colon.                           - One diminutive polyp in  the transverse colon,                            removed with a cold biopsy forceps. Resected and                            retrieved.                           - Patent end-to-side colo-rectal anastomosis,                            characterized by healthy appearing mucosa.                           - The examination was otherwise normal on direct                            and retroflexion views. Recommendation:           - Patient has a contact number available for  emergencies. The signs and symptoms of potential                            delayed complications were discussed with the                            patient. Return to normal activities tomorrow.                            Written discharge instructions were provided to the                            patient.                           - Resume previous diet.                           - Continue present medications.                           - Await pathology results.                           - Repeat colonoscopy in 5 years for surveillance. Henry L. Loletha Carrow, MD 04/19/2019 9:26:24 AM This report has been signed electronically.

## 2019-04-19 NOTE — Progress Notes (Signed)
Pt's states no medical or surgical changes since previsit or office visit.Vitals-Nancy C. Temp-Lisa

## 2019-04-22 ENCOUNTER — Other Ambulatory Visit: Payer: Self-pay | Admitting: Internal Medicine

## 2019-04-22 DIAGNOSIS — Z1231 Encounter for screening mammogram for malignant neoplasm of breast: Secondary | ICD-10-CM

## 2019-04-23 ENCOUNTER — Telehealth: Payer: Self-pay | Admitting: *Deleted

## 2019-04-23 ENCOUNTER — Telehealth: Payer: Self-pay

## 2019-04-23 NOTE — Telephone Encounter (Signed)
  Follow up Call-  Call back number 04/19/2019  Post procedure Call Back phone  # [phone]  Permission to leave phone message Yes  Some recent data might be hidden     Patient questions:  Do you have a fever, pain , or abdominal swelling? No. Pain Score  0 *  Have you tolerated food without any problems? Yes.    Have you been able to return to your normal activities? Yes.    Do you have any questions about your discharge instructions: Diet   No. Medications  No. Follow up visit  No.  Do you have questions or concerns about your Care? No.  Actions: * If pain score is 4 or above: No action needed, pain <4.  1. Have you developed a fever since your procedure? no  2.   Have you had an respiratory symptoms (SOB or cough) since your procedure? no  3.   Have you tested positive for COVID 19 since your procedure no  4.   Have you had any family members/close contacts diagnosed with the COVID 19 since your procedure?  no   If yes to any of these questions please route to Joylene John, RN and Alphonsa Gin, Therapist, sports.

## 2019-04-23 NOTE — Telephone Encounter (Signed)
  Follow up Call-  Call back number 04/19/2019  Post procedure Call Back phone  # [phone]  Permission to leave phone message Yes  Some recent data might be hidden     Patient questions:  Message left to call us if necessary.

## 2019-04-24 ENCOUNTER — Encounter: Payer: Self-pay | Admitting: Gastroenterology

## 2019-06-04 ENCOUNTER — Other Ambulatory Visit: Payer: Self-pay

## 2019-06-04 ENCOUNTER — Ambulatory Visit
Admission: RE | Admit: 2019-06-04 | Discharge: 2019-06-04 | Disposition: A | Discharge status: Home / Self Care | Payer: Medicare Other | Source: Ambulatory Visit | Attending: Internal Medicine | Admitting: Internal Medicine

## 2019-06-04 DIAGNOSIS — Z1231 Encounter for screening mammogram for malignant neoplasm of breast: Secondary | ICD-10-CM

## 2020-05-04 ENCOUNTER — Other Ambulatory Visit: Payer: Self-pay | Admitting: Internal Medicine

## 2020-05-04 DIAGNOSIS — Z1231 Encounter for screening mammogram for malignant neoplasm of breast: Secondary | ICD-10-CM

## 2020-06-10 ENCOUNTER — Other Ambulatory Visit: Payer: Self-pay

## 2020-06-10 ENCOUNTER — Ambulatory Visit
Admission: RE | Admit: 2020-06-10 | Discharge: 2020-06-10 | Disposition: A | Discharge status: Home / Self Care | Payer: Medicare Other | Source: Ambulatory Visit | Attending: Internal Medicine | Admitting: Internal Medicine

## 2020-06-10 DIAGNOSIS — Z1231 Encounter for screening mammogram for malignant neoplasm of breast: Secondary | ICD-10-CM

## 2020-09-07 DIAGNOSIS — M21611 Bunion of right foot: Secondary | ICD-10-CM | POA: Diagnosis not present

## 2020-09-07 DIAGNOSIS — L603 Nail dystrophy: Secondary | ICD-10-CM | POA: Diagnosis not present

## 2020-09-07 DIAGNOSIS — L602 Onychogryphosis: Secondary | ICD-10-CM | POA: Diagnosis not present

## 2020-09-07 DIAGNOSIS — M21612 Bunion of left foot: Secondary | ICD-10-CM | POA: Diagnosis not present

## 2020-09-07 DIAGNOSIS — B353 Tinea pedis: Secondary | ICD-10-CM | POA: Diagnosis not present

## 2020-09-11 DIAGNOSIS — L603 Nail dystrophy: Secondary | ICD-10-CM | POA: Diagnosis not present

## 2020-09-28 DIAGNOSIS — M21611 Bunion of right foot: Secondary | ICD-10-CM | POA: Diagnosis not present

## 2020-09-28 DIAGNOSIS — L602 Onychogryphosis: Secondary | ICD-10-CM | POA: Diagnosis not present

## 2020-09-28 DIAGNOSIS — M898X7 Other specified disorders of bone, ankle and foot: Secondary | ICD-10-CM | POA: Diagnosis not present

## 2020-09-28 DIAGNOSIS — M21612 Bunion of left foot: Secondary | ICD-10-CM | POA: Diagnosis not present

## 2020-10-20 DIAGNOSIS — Z0100 Encounter for examination of eyes and vision without abnormal findings: Secondary | ICD-10-CM | POA: Diagnosis not present

## 2020-11-27 DIAGNOSIS — I700 Atherosclerosis of aorta: Secondary | ICD-10-CM | POA: Diagnosis not present

## 2020-11-27 DIAGNOSIS — N182 Chronic kidney disease, stage 2 (mild): Secondary | ICD-10-CM | POA: Diagnosis not present

## 2020-11-27 DIAGNOSIS — E785 Hyperlipidemia, unspecified: Secondary | ICD-10-CM | POA: Diagnosis not present

## 2020-11-27 DIAGNOSIS — E669 Obesity, unspecified: Secondary | ICD-10-CM | POA: Diagnosis not present

## 2020-11-27 DIAGNOSIS — Z6830 Body mass index (BMI) 30.0-30.9, adult: Secondary | ICD-10-CM | POA: Diagnosis not present

## 2020-11-27 DIAGNOSIS — E1122 Type 2 diabetes mellitus with diabetic chronic kidney disease: Secondary | ICD-10-CM | POA: Diagnosis not present

## 2020-11-27 DIAGNOSIS — I129 Hypertensive chronic kidney disease with stage 1 through stage 4 chronic kidney disease, or unspecified chronic kidney disease: Secondary | ICD-10-CM | POA: Diagnosis not present

## 2021-04-06 DIAGNOSIS — E785 Hyperlipidemia, unspecified: Secondary | ICD-10-CM | POA: Diagnosis not present

## 2021-04-06 DIAGNOSIS — E1122 Type 2 diabetes mellitus with diabetic chronic kidney disease: Secondary | ICD-10-CM | POA: Diagnosis not present

## 2021-04-06 DIAGNOSIS — E039 Hypothyroidism, unspecified: Secondary | ICD-10-CM | POA: Diagnosis not present

## 2021-04-06 DIAGNOSIS — I129 Hypertensive chronic kidney disease with stage 1 through stage 4 chronic kidney disease, or unspecified chronic kidney disease: Secondary | ICD-10-CM | POA: Diagnosis not present

## 2021-04-13 DIAGNOSIS — I700 Atherosclerosis of aorta: Secondary | ICD-10-CM | POA: Diagnosis not present

## 2021-04-13 DIAGNOSIS — N182 Chronic kidney disease, stage 2 (mild): Secondary | ICD-10-CM | POA: Diagnosis not present

## 2021-04-13 DIAGNOSIS — Z1339 Encounter for screening examination for other mental health and behavioral disorders: Secondary | ICD-10-CM | POA: Diagnosis not present

## 2021-04-13 DIAGNOSIS — E039 Hypothyroidism, unspecified: Secondary | ICD-10-CM | POA: Diagnosis not present

## 2021-04-13 DIAGNOSIS — E785 Hyperlipidemia, unspecified: Secondary | ICD-10-CM | POA: Diagnosis not present

## 2021-04-13 DIAGNOSIS — Z1331 Encounter for screening for depression: Secondary | ICD-10-CM | POA: Diagnosis not present

## 2021-04-13 DIAGNOSIS — E1122 Type 2 diabetes mellitus with diabetic chronic kidney disease: Secondary | ICD-10-CM | POA: Diagnosis not present

## 2021-04-13 DIAGNOSIS — I129 Hypertensive chronic kidney disease with stage 1 through stage 4 chronic kidney disease, or unspecified chronic kidney disease: Secondary | ICD-10-CM | POA: Diagnosis not present

## 2021-04-13 DIAGNOSIS — Z0000 Encounter for general adult medical examination without abnormal findings: Secondary | ICD-10-CM | POA: Diagnosis not present

## 2021-04-13 DIAGNOSIS — Z6831 Body mass index (BMI) 31.0-31.9, adult: Secondary | ICD-10-CM | POA: Diagnosis not present

## 2021-04-13 DIAGNOSIS — E669 Obesity, unspecified: Secondary | ICD-10-CM | POA: Diagnosis not present

## 2021-05-05 ENCOUNTER — Other Ambulatory Visit: Payer: Self-pay | Admitting: Internal Medicine

## 2021-05-05 DIAGNOSIS — Z1231 Encounter for screening mammogram for malignant neoplasm of breast: Secondary | ICD-10-CM

## 2021-05-21 DIAGNOSIS — I10 Essential (primary) hypertension: Secondary | ICD-10-CM | POA: Diagnosis not present

## 2021-05-21 DIAGNOSIS — M62830 Muscle spasm of back: Secondary | ICD-10-CM | POA: Diagnosis not present

## 2021-05-21 DIAGNOSIS — S29012A Strain of muscle and tendon of back wall of thorax, initial encounter: Secondary | ICD-10-CM | POA: Diagnosis not present

## 2021-06-01 DIAGNOSIS — M546 Pain in thoracic spine: Secondary | ICD-10-CM | POA: Diagnosis not present

## 2021-06-14 ENCOUNTER — Ambulatory Visit
Admission: RE | Admit: 2021-06-14 | Discharge: 2021-06-14 | Disposition: A | Discharge status: Home / Self Care | Payer: Medicare HMO | Source: Ambulatory Visit | Attending: Internal Medicine | Admitting: Internal Medicine

## 2021-06-14 DIAGNOSIS — Z1231 Encounter for screening mammogram for malignant neoplasm of breast: Secondary | ICD-10-CM

## 2021-06-15 DIAGNOSIS — Z0100 Encounter for examination of eyes and vision without abnormal findings: Secondary | ICD-10-CM | POA: Diagnosis not present

## 2021-06-22 DIAGNOSIS — M546 Pain in thoracic spine: Secondary | ICD-10-CM | POA: Diagnosis not present

## 2021-07-20 DIAGNOSIS — M5451 Vertebrogenic low back pain: Secondary | ICD-10-CM | POA: Diagnosis not present

## 2021-08-17 DIAGNOSIS — E669 Obesity, unspecified: Secondary | ICD-10-CM | POA: Diagnosis not present

## 2021-08-17 DIAGNOSIS — N182 Chronic kidney disease, stage 2 (mild): Secondary | ICD-10-CM | POA: Diagnosis not present

## 2021-08-17 DIAGNOSIS — I129 Hypertensive chronic kidney disease with stage 1 through stage 4 chronic kidney disease, or unspecified chronic kidney disease: Secondary | ICD-10-CM | POA: Diagnosis not present

## 2021-08-17 DIAGNOSIS — E785 Hyperlipidemia, unspecified: Secondary | ICD-10-CM | POA: Diagnosis not present

## 2021-08-17 DIAGNOSIS — E1122 Type 2 diabetes mellitus with diabetic chronic kidney disease: Secondary | ICD-10-CM | POA: Diagnosis not present

## 2021-08-17 DIAGNOSIS — I700 Atherosclerosis of aorta: Secondary | ICD-10-CM | POA: Diagnosis not present

## 2021-08-24 DIAGNOSIS — Z0100 Encounter for examination of eyes and vision without abnormal findings: Secondary | ICD-10-CM | POA: Diagnosis not present

## 2021-12-21 DIAGNOSIS — L723 Sebaceous cyst: Secondary | ICD-10-CM | POA: Diagnosis not present

## 2021-12-23 DIAGNOSIS — E1122 Type 2 diabetes mellitus with diabetic chronic kidney disease: Secondary | ICD-10-CM | POA: Diagnosis not present

## 2021-12-23 DIAGNOSIS — E663 Overweight: Secondary | ICD-10-CM | POA: Diagnosis not present

## 2021-12-23 DIAGNOSIS — I129 Hypertensive chronic kidney disease with stage 1 through stage 4 chronic kidney disease, or unspecified chronic kidney disease: Secondary | ICD-10-CM | POA: Diagnosis not present

## 2021-12-23 DIAGNOSIS — Z6828 Body mass index (BMI) 28.0-28.9, adult: Secondary | ICD-10-CM | POA: Diagnosis not present

## 2022-02-09 DIAGNOSIS — L720 Epidermal cyst: Secondary | ICD-10-CM | POA: Diagnosis not present

## 2022-02-09 DIAGNOSIS — D485 Neoplasm of uncertain behavior of skin: Secondary | ICD-10-CM | POA: Diagnosis not present

## 2022-04-20 DIAGNOSIS — F418 Other specified anxiety disorders: Secondary | ICD-10-CM | POA: Diagnosis not present

## 2022-04-20 DIAGNOSIS — R7989 Other specified abnormal findings of blood chemistry: Secondary | ICD-10-CM | POA: Diagnosis not present

## 2022-04-20 DIAGNOSIS — E785 Hyperlipidemia, unspecified: Secondary | ICD-10-CM | POA: Diagnosis not present

## 2022-04-20 DIAGNOSIS — E039 Hypothyroidism, unspecified: Secondary | ICD-10-CM | POA: Diagnosis not present

## 2022-04-20 DIAGNOSIS — R739 Hyperglycemia, unspecified: Secondary | ICD-10-CM | POA: Diagnosis not present

## 2022-04-26 DIAGNOSIS — N182 Chronic kidney disease, stage 2 (mild): Secondary | ICD-10-CM | POA: Diagnosis not present

## 2022-04-26 DIAGNOSIS — E039 Hypothyroidism, unspecified: Secondary | ICD-10-CM | POA: Diagnosis not present

## 2022-04-26 DIAGNOSIS — I129 Hypertensive chronic kidney disease with stage 1 through stage 4 chronic kidney disease, or unspecified chronic kidney disease: Secondary | ICD-10-CM | POA: Diagnosis not present

## 2022-04-26 DIAGNOSIS — Z1331 Encounter for screening for depression: Secondary | ICD-10-CM | POA: Diagnosis not present

## 2022-04-26 DIAGNOSIS — E1122 Type 2 diabetes mellitus with diabetic chronic kidney disease: Secondary | ICD-10-CM | POA: Diagnosis not present

## 2022-04-26 DIAGNOSIS — Z0000 Encounter for general adult medical examination without abnormal findings: Secondary | ICD-10-CM | POA: Diagnosis not present

## 2022-04-26 DIAGNOSIS — Z1339 Encounter for screening examination for other mental health and behavioral disorders: Secondary | ICD-10-CM | POA: Diagnosis not present

## 2022-04-26 DIAGNOSIS — Z85038 Personal history of other malignant neoplasm of large intestine: Secondary | ICD-10-CM | POA: Diagnosis not present

## 2022-04-26 DIAGNOSIS — R82998 Other abnormal findings in urine: Secondary | ICD-10-CM | POA: Diagnosis not present

## 2022-04-26 DIAGNOSIS — I700 Atherosclerosis of aorta: Secondary | ICD-10-CM | POA: Diagnosis not present

## 2022-04-26 DIAGNOSIS — E663 Overweight: Secondary | ICD-10-CM | POA: Diagnosis not present

## 2022-04-26 DIAGNOSIS — E785 Hyperlipidemia, unspecified: Secondary | ICD-10-CM | POA: Diagnosis not present

## 2022-05-05 ENCOUNTER — Other Ambulatory Visit: Payer: Self-pay | Admitting: Internal Medicine

## 2022-05-05 DIAGNOSIS — Z1231 Encounter for screening mammogram for malignant neoplasm of breast: Secondary | ICD-10-CM

## 2022-06-16 DIAGNOSIS — H25013 Cortical age-related cataract, bilateral: Secondary | ICD-10-CM | POA: Diagnosis not present

## 2022-06-16 DIAGNOSIS — H2513 Age-related nuclear cataract, bilateral: Secondary | ICD-10-CM | POA: Diagnosis not present

## 2022-06-16 DIAGNOSIS — R7303 Prediabetes: Secondary | ICD-10-CM | POA: Diagnosis not present

## 2022-06-24 ENCOUNTER — Ambulatory Visit
Admission: RE | Admit: 2022-06-24 | Discharge: 2022-06-24 | Disposition: A | Discharge status: Home / Self Care | Payer: Medicare HMO | Source: Ambulatory Visit | Attending: Internal Medicine | Admitting: Internal Medicine

## 2022-06-24 DIAGNOSIS — Z1231 Encounter for screening mammogram for malignant neoplasm of breast: Secondary | ICD-10-CM | POA: Diagnosis not present

## 2023-05-02 DIAGNOSIS — E785 Hyperlipidemia, unspecified: Secondary | ICD-10-CM | POA: Diagnosis not present

## 2023-05-02 DIAGNOSIS — E039 Hypothyroidism, unspecified: Secondary | ICD-10-CM | POA: Diagnosis not present

## 2023-05-02 DIAGNOSIS — E049 Nontoxic goiter, unspecified: Secondary | ICD-10-CM | POA: Diagnosis not present

## 2023-05-02 DIAGNOSIS — Z0189 Encounter for other specified special examinations: Secondary | ICD-10-CM | POA: Diagnosis not present

## 2023-05-02 DIAGNOSIS — Z0001 Encounter for general adult medical examination with abnormal findings: Secondary | ICD-10-CM | POA: Diagnosis not present

## 2023-05-02 DIAGNOSIS — N182 Chronic kidney disease, stage 2 (mild): Secondary | ICD-10-CM | POA: Diagnosis not present

## 2023-05-02 DIAGNOSIS — E1122 Type 2 diabetes mellitus with diabetic chronic kidney disease: Secondary | ICD-10-CM | POA: Diagnosis not present

## 2023-05-09 DIAGNOSIS — Z6829 Body mass index (BMI) 29.0-29.9, adult: Secondary | ICD-10-CM | POA: Diagnosis not present

## 2023-05-09 DIAGNOSIS — E785 Hyperlipidemia, unspecified: Secondary | ICD-10-CM | POA: Diagnosis not present

## 2023-05-09 DIAGNOSIS — E663 Overweight: Secondary | ICD-10-CM | POA: Diagnosis not present

## 2023-05-09 DIAGNOSIS — I700 Atherosclerosis of aorta: Secondary | ICD-10-CM | POA: Diagnosis not present

## 2023-05-09 DIAGNOSIS — N182 Chronic kidney disease, stage 2 (mild): Secondary | ICD-10-CM | POA: Diagnosis not present

## 2023-05-09 DIAGNOSIS — Z0000 Encounter for general adult medical examination without abnormal findings: Secondary | ICD-10-CM | POA: Diagnosis not present

## 2023-05-09 DIAGNOSIS — Z1339 Encounter for screening examination for other mental health and behavioral disorders: Secondary | ICD-10-CM | POA: Diagnosis not present

## 2023-05-09 DIAGNOSIS — E1122 Type 2 diabetes mellitus with diabetic chronic kidney disease: Secondary | ICD-10-CM | POA: Diagnosis not present

## 2023-05-09 DIAGNOSIS — E039 Hypothyroidism, unspecified: Secondary | ICD-10-CM | POA: Diagnosis not present

## 2023-05-09 DIAGNOSIS — I129 Hypertensive chronic kidney disease with stage 1 through stage 4 chronic kidney disease, or unspecified chronic kidney disease: Secondary | ICD-10-CM | POA: Diagnosis not present

## 2023-05-09 DIAGNOSIS — Z1331 Encounter for screening for depression: Secondary | ICD-10-CM | POA: Diagnosis not present

## 2023-05-18 DIAGNOSIS — L219 Seborrheic dermatitis, unspecified: Secondary | ICD-10-CM | POA: Diagnosis not present

## 2023-05-18 DIAGNOSIS — L658 Other specified nonscarring hair loss: Secondary | ICD-10-CM | POA: Diagnosis not present

## 2023-06-28 DIAGNOSIS — Z1231 Encounter for screening mammogram for malignant neoplasm of breast: Secondary | ICD-10-CM | POA: Diagnosis not present

## 2023-06-29 DIAGNOSIS — H2513 Age-related nuclear cataract, bilateral: Secondary | ICD-10-CM | POA: Diagnosis not present

## 2023-06-29 DIAGNOSIS — H43393 Other vitreous opacities, bilateral: Secondary | ICD-10-CM | POA: Diagnosis not present

## 2023-07-15 DIAGNOSIS — N1831 Chronic kidney disease, stage 3a: Secondary | ICD-10-CM | POA: Diagnosis not present

## 2023-07-15 DIAGNOSIS — Z809 Family history of malignant neoplasm, unspecified: Secondary | ICD-10-CM | POA: Diagnosis not present

## 2023-07-15 DIAGNOSIS — E039 Hypothyroidism, unspecified: Secondary | ICD-10-CM | POA: Diagnosis not present

## 2023-07-15 DIAGNOSIS — E049 Nontoxic goiter, unspecified: Secondary | ICD-10-CM | POA: Diagnosis not present

## 2023-07-15 DIAGNOSIS — Z79890 Hormone replacement therapy (postmenopausal): Secondary | ICD-10-CM | POA: Diagnosis not present

## 2023-07-15 DIAGNOSIS — I700 Atherosclerosis of aorta: Secondary | ICD-10-CM | POA: Diagnosis not present

## 2023-07-15 DIAGNOSIS — I129 Hypertensive chronic kidney disease with stage 1 through stage 4 chronic kidney disease, or unspecified chronic kidney disease: Secondary | ICD-10-CM | POA: Diagnosis not present

## 2023-07-15 DIAGNOSIS — E785 Hyperlipidemia, unspecified: Secondary | ICD-10-CM | POA: Diagnosis not present

## 2023-07-15 DIAGNOSIS — F419 Anxiety disorder, unspecified: Secondary | ICD-10-CM | POA: Diagnosis not present

## 2023-09-07 DIAGNOSIS — Z6829 Body mass index (BMI) 29.0-29.9, adult: Secondary | ICD-10-CM | POA: Diagnosis not present

## 2023-09-07 DIAGNOSIS — E663 Overweight: Secondary | ICD-10-CM | POA: Diagnosis not present

## 2023-09-07 DIAGNOSIS — E1122 Type 2 diabetes mellitus with diabetic chronic kidney disease: Secondary | ICD-10-CM | POA: Diagnosis not present

## 2023-09-07 DIAGNOSIS — I129 Hypertensive chronic kidney disease with stage 1 through stage 4 chronic kidney disease, or unspecified chronic kidney disease: Secondary | ICD-10-CM | POA: Diagnosis not present

## 2023-09-07 DIAGNOSIS — N182 Chronic kidney disease, stage 2 (mild): Secondary | ICD-10-CM | POA: Diagnosis not present

## 2023-09-07 DIAGNOSIS — I700 Atherosclerosis of aorta: Secondary | ICD-10-CM | POA: Diagnosis not present

## 2023-09-07 DIAGNOSIS — R300 Dysuria: Secondary | ICD-10-CM | POA: Diagnosis not present

## 2023-09-07 DIAGNOSIS — E785 Hyperlipidemia, unspecified: Secondary | ICD-10-CM | POA: Diagnosis not present

## 2023-09-13 DIAGNOSIS — Z0100 Encounter for examination of eyes and vision without abnormal findings: Secondary | ICD-10-CM | POA: Diagnosis not present

## 2023-11-21 DIAGNOSIS — L821 Other seborrheic keratosis: Secondary | ICD-10-CM | POA: Diagnosis not present

## 2023-11-21 DIAGNOSIS — L700 Acne vulgaris: Secondary | ICD-10-CM | POA: Diagnosis not present

## 2023-11-21 DIAGNOSIS — L738 Other specified follicular disorders: Secondary | ICD-10-CM | POA: Diagnosis not present

## 2023-11-21 DIAGNOSIS — L603 Nail dystrophy: Secondary | ICD-10-CM | POA: Diagnosis not present

## 2023-12-14 DIAGNOSIS — Z6830 Body mass index (BMI) 30.0-30.9, adult: Secondary | ICD-10-CM | POA: Diagnosis not present

## 2023-12-14 DIAGNOSIS — Z713 Dietary counseling and surveillance: Secondary | ICD-10-CM | POA: Diagnosis not present

## 2023-12-14 DIAGNOSIS — E1122 Type 2 diabetes mellitus with diabetic chronic kidney disease: Secondary | ICD-10-CM | POA: Diagnosis not present

## 2023-12-14 DIAGNOSIS — E785 Hyperlipidemia, unspecified: Secondary | ICD-10-CM | POA: Diagnosis not present

## 2023-12-14 DIAGNOSIS — E669 Obesity, unspecified: Secondary | ICD-10-CM | POA: Diagnosis not present

## 2023-12-14 DIAGNOSIS — N182 Chronic kidney disease, stage 2 (mild): Secondary | ICD-10-CM | POA: Diagnosis not present

## 2023-12-14 DIAGNOSIS — I129 Hypertensive chronic kidney disease with stage 1 through stage 4 chronic kidney disease, or unspecified chronic kidney disease: Secondary | ICD-10-CM | POA: Diagnosis not present

## 2024-03-04 DIAGNOSIS — I10 Essential (primary) hypertension: Secondary | ICD-10-CM | POA: Diagnosis not present

## 2024-03-04 DIAGNOSIS — E039 Hypothyroidism, unspecified: Secondary | ICD-10-CM | POA: Diagnosis not present

## 2024-03-04 DIAGNOSIS — Z9079 Acquired absence of other genital organ(s): Secondary | ICD-10-CM | POA: Diagnosis not present

## 2024-03-04 DIAGNOSIS — N814 Uterovaginal prolapse, unspecified: Secondary | ICD-10-CM | POA: Diagnosis not present

## 2024-03-04 DIAGNOSIS — Z90722 Acquired absence of ovaries, bilateral: Secondary | ICD-10-CM | POA: Diagnosis not present

## 2024-03-04 DIAGNOSIS — Z90710 Acquired absence of both cervix and uterus: Secondary | ICD-10-CM | POA: Diagnosis not present

## 2024-03-04 DIAGNOSIS — E119 Type 2 diabetes mellitus without complications: Secondary | ICD-10-CM | POA: Diagnosis not present

## 2024-03-04 DIAGNOSIS — N993 Prolapse of vaginal vault after hysterectomy: Secondary | ICD-10-CM | POA: Diagnosis not present

## 2024-03-26 ENCOUNTER — Encounter: Payer: Self-pay | Admitting: Gastroenterology

## 2024-04-01 ENCOUNTER — Encounter: Payer: Self-pay | Admitting: Gastroenterology

## 2024-04-10 ENCOUNTER — Ambulatory Visit

## 2024-04-10 VITALS — Ht 66.0 in | Wt 174.0 lb

## 2024-04-10 DIAGNOSIS — Z85038 Personal history of other malignant neoplasm of large intestine: Secondary | ICD-10-CM

## 2024-04-10 DIAGNOSIS — Z860100 Personal history of colon polyps, unspecified: Secondary | ICD-10-CM

## 2024-04-10 MED ORDER — NA SULFATE-K SULFATE-MG SULF 17.5-3.13-1.6 GM/177ML PO SOLN: 1.0000 | Freq: Once | ORAL | 0 refills | Status: AC

## 2024-04-10 NOTE — Progress Notes (Signed)

## 2024-04-11 ENCOUNTER — Encounter: Payer: Self-pay | Admitting: Gastroenterology

## 2024-04-24 ENCOUNTER — Encounter: Payer: Self-pay | Admitting: Gastroenterology

## 2024-04-24 ENCOUNTER — Ambulatory Visit: Admitting: Gastroenterology

## 2024-04-24 VITALS — BP 141/58 | HR 68 | Temp 99.3°F | Resp 14 | Ht 66.0 in | Wt 174.0 lb

## 2024-04-24 DIAGNOSIS — Z980 Intestinal bypass and anastomosis status: Secondary | ICD-10-CM | POA: Diagnosis not present

## 2024-04-24 DIAGNOSIS — Z1211 Encounter for screening for malignant neoplasm of colon: Secondary | ICD-10-CM

## 2024-04-24 DIAGNOSIS — Z85038 Personal history of other malignant neoplasm of large intestine: Secondary | ICD-10-CM

## 2024-04-24 DIAGNOSIS — K648 Other hemorrhoids: Secondary | ICD-10-CM | POA: Diagnosis not present

## 2024-04-24 DIAGNOSIS — K6289 Other specified diseases of anus and rectum: Secondary | ICD-10-CM

## 2024-04-24 DIAGNOSIS — D124 Benign neoplasm of descending colon: Secondary | ICD-10-CM | POA: Diagnosis not present

## 2024-04-24 DIAGNOSIS — E039 Hypothyroidism, unspecified: Secondary | ICD-10-CM | POA: Diagnosis not present

## 2024-04-24 DIAGNOSIS — K635 Polyp of colon: Secondary | ICD-10-CM | POA: Diagnosis not present

## 2024-04-24 DIAGNOSIS — I10 Essential (primary) hypertension: Secondary | ICD-10-CM | POA: Diagnosis not present

## 2024-04-24 MED ORDER — SODIUM CHLORIDE 0.9 % IV SOLN: 500.0000 mL | Freq: Once | INTRAVENOUS | Status: DC

## 2024-04-24 NOTE — Progress Notes (Unsigned)
 History and Physical:  This patient presents for endoscopic testing for: Encounter Diagnosis  Name Primary?   History of colon cancer Yes    73 year old woman here today for surveillance with a personal history of colon cancer.  She had resection for colorectal adenocarcinoma in 2015, and no polyps were found in 2017.  On last colonoscopy October 2020, some polypoid tissue was removed with pathology revealing normal tissue.  Patient is otherwise without complaints or active issues today.   Past Medical History: Past Medical History:  Diagnosis Date   Acute cholecystitis with chronic cholecystitis s/p lap chole 09May2013 11/15/2011   Allergy    Anemia    age 39   Cancer (HCC) 2014   colon cancer   Complication of anesthesia 06/27/2013   nausea   Environmental allergies    Gallstones    Hypertension    Hypothyroidism    Thyroid  disease      Past Surgical History: Past Surgical History:  Procedure Laterality Date   ABDOMINAL HYSTERECTOMY     hx. fibroids   CHOLECYSTECTOMY  11/17/11   laparoscopic   COLON SURGERY  2014   COLONOSCOPY     DILATION AND CURETTAGE OF UTERUS     FLEXIBLE SIGMOIDOSCOPY N/A 07/22/2013   Procedure: FLEXIBLE SIGMOIDOSCOPY;  Surgeon: Princella CHRISTELLA Nida, MD;  Location: [facility];  Service: Endoscopy;  Laterality: N/A;  ja/    tatoo   POLYPECTOMY     SIGMOIDOSCOPY     TUBAL LIGATION      Allergies: No Known Allergies  Outpatient Meds: Current Outpatient Medications  Medication Sig Dispense Refill   hydrochlorothiazide  (HYDRODIURIL ) 25 MG tablet Take 12.5 mg by mouth daily.     Lactobacillus-Inulin (CULTURELLE DIGESTIVE HEALTH) CAPS Take 1 capsule by mouth daily.     levothyroxine (SYNTHROID, LEVOTHROID) 75 MCG tablet Take 75 mcg by mouth daily with breakfast.      metoprolol  succinate (TOPROL -XL) 50 MG 24 hr tablet Take 50 mg by mouth daily.  0   rosuvastatin (CRESTOR) 10 MG tablet Take 10 mg by mouth at bedtime.     tretinoin (RETIN-A) 0.025 %  cream Apply 1 Application topically at bedtime.     valsartan  (DIOVAN ) 320 MG tablet Take 320 mg by mouth daily.     fluticasone (FLONASE) 50 MCG/ACT nasal spray USE 2 SPRAYS INTO EACH NOSTRIL DAILY AT NIGHT (Patient not taking: No sig reported)  5   LORazepam  (ATIVAN ) 1 MG tablet Take 1 mg by mouth daily as needed.     Misc Natural Products (BEET ROOT) 500 MG CAPS Take 1 capsule by mouth daily.     Multiple Vitamin (MULTIVITAMIN) tablet Take 1 tablet by mouth daily.     valACYclovir  (VALTREX ) 1000 MG tablet Take 1,000 mg by mouth daily as needed (outbreaks).      Current Facility-Administered Medications  Medication Dose Route Frequency Provider Last Rate Last Admin   0.9 %  sodium chloride  infusion  500 mL Intravenous Once Danis, Kendal Ghazarian L III, MD          ___________________________________________________________________ Objective   Exam:  BP (!) 192/86   Pulse 76   Temp 99.3 F (37.4 C)   Resp 15   Ht 5' 6 (1.676 m)   Wt 174 lb (78.9 kg)   SpO2 100%   BMI 28.08 kg/m   CV: regular , S1/S2 Resp: clear to auscultation bilaterally, normal RR and effort noted GI: soft, no tenderness, with active bowel sounds.   Assessment: Encounter Diagnosis  Name Primary?   History of colon cancer Yes     Plan: Colonoscopy   The benefits and risks of the planned procedure(s) were described in detail with the patient or (when appropriate) their health care proxy.  Risks were outlined as including, but not limited to, bleeding, infection, perforation, adverse medication reaction leading to cardiac or pulmonary decompensation, pancreatitis (if ERCP).  The limitation of incomplete mucosal visualization was also discussed.  No guarantees or warranties were given.  The patient is appropriate for an endoscopic procedure in the ambulatory setting.   - Victory Brand, MD

## 2024-04-24 NOTE — Patient Instructions (Signed)

## 2024-04-24 NOTE — Progress Notes (Unsigned)
 Called to room to assist during endoscopic procedure.  Patient ID and intended procedure confirmed with present staff. Received instructions for my participation in the procedure from the performing physician.

## 2024-04-24 NOTE — Progress Notes (Signed)
 Pt's states no medical or surgical changes since previsit or office visit.

## 2024-04-24 NOTE — Op Note (Signed)
 [facility] Patient Name: Christine Shepherd Procedure Date: 04/24/2024 3:46 PM MRN: [id number] Endoscopist: Victory L. Legrand , MD, [id number] Age: 73 Referring MD:  Date of Birth: 02-28-1951 Gender: Female Account #: [id number] Procedure:                Colonoscopy Indications:              High risk colon cancer surveillance: Personal                            history of colon cancer Medicines:                Monitored Anesthesia Care Procedure:                Pre-Anesthesia Assessment:                           - Prior to the procedure, a History and Physical                            was performed, and patient medications and                            allergies were reviewed. The patient's tolerance of                            previous anesthesia was also reviewed. The risks                            and benefits of the procedure and the sedation                            options and risks were discussed with the patient.                            All questions were answered, and informed consent                            was obtained. Prior Anticoagulants: The patient has                            taken no anticoagulant or antiplatelet agents. ASA                            Grade Assessment: II - A patient with mild systemic                            disease. After reviewing the risks and benefits,                            the patient was deemed in satisfactory condition to                            undergo the procedure.  After obtaining informed consent, the colonoscope                            was passed under direct vision. Throughout the                            procedure, the patient's blood pressure, pulse, and                            oxygen saturations were monitored continuously. The                            CF [id number] #[id number] was introduced through the anus                            and advanced to the the cecum,  identified by                            appendiceal orifice and ileocecal valve. The                            colonoscopy was somewhat difficult due to a                            redundant colon. Successful completion of the                            procedure was aided by using manual pressure and                            straightening and shortening the scope to obtain                            bowel loop reduction. The patient tolerated the                            procedure well. The quality of the bowel                            preparation was good. The ileocecal valve,                            appendiceal orifice, and rectum were photographed. Scope In: 4:11:52 PM Scope Out: 4:28:40 PM Scope Withdrawal Time: 0 hours 10 minutes 18 seconds  Total Procedure Duration: 0 hours 16 minutes 48 seconds  Findings:                 The digital rectal exam findings include decreased                            sphincter tone.                           Repeat examination of right colon under NBI  performed.                           Two flat polyps were found in the descending colon.                            The polyps were 6 to 8 mm in size. These polyps                            were removed with a cold snare. Resection and                            retrieval were complete.                           There was evidence of a prior end-to-end                            colo-rectal anastomosis in the proximal rectum.                            This was patent and was characterized by healthy                            appearing mucosa. The anastomosis was traversed.                           Retroflexion in the rectum was not performed due to                            anatomy.                           Internal hemorrhoids were found.                           The exam was otherwise without abnormality. Complications:            No immediate  complications. Estimated Blood Loss:     Estimated blood loss was minimal. Impression:               - Decreased sphincter tone found on digital rectal                            exam.                           - Two 6 to 8 mm polyps in the descending colon,                            removed with a cold snare. Resected and retrieved.                            (Possibly hyperplastic by [facility] and NBI appearance)                           -  Patent end-to-end colo-rectal anastomosis,                            characterized by healthy appearing mucosa.                           - Internal hemorrhoids.                           - The examination was otherwise normal. Recommendation:           - Patient has a contact number available for                            emergencies. The signs and symptoms of potential                            delayed complications were discussed with the                            patient. Return to normal activities tomorrow.                            Written discharge instructions were provided to the                            patient.                           - Resume previous diet.                           - Continue present medications.                           - Await pathology results.                           - Repeat colonoscopy is recommended for                            surveillance. The colonoscopy date will be                            determined after pathology results from today's                            exam become available for review. Tjuana Vickrey L. Legrand, MD 04/24/2024 4:34:18 PM This report has been signed electronically.

## 2024-04-24 NOTE — Progress Notes (Unsigned)
 Vss nad trans to pacu

## 2024-04-25 ENCOUNTER — Telehealth: Payer: Self-pay | Admitting: *Deleted

## 2024-04-25 NOTE — Telephone Encounter (Signed)
  Follow up Call-     04/24/2024    2:59 PM  Call back number  Post procedure Call Back phone  # [phone]  Permission to leave phone message Yes     Patient questions:  Do you have a fever, pain , or abdominal swelling? No. Pain Score  0 *  Have you tolerated food without any problems? Yes.    Have you been able to return to your normal activities? Yes.    Do you have any questions about your discharge instructions: Diet   No. Medications  No. Follow up visit  No.  Do you have questions or concerns about your Care? No.  Actions: * If pain score is 4 or above: No action needed, pain <4.

## 2024-04-29 ENCOUNTER — Ambulatory Visit: Payer: Self-pay | Admitting: Gastroenterology

## 2024-04-29 LAB — SURGICAL PATHOLOGY

## 2024-05-09 DIAGNOSIS — I129 Hypertensive chronic kidney disease with stage 1 through stage 4 chronic kidney disease, or unspecified chronic kidney disease: Secondary | ICD-10-CM | POA: Diagnosis not present

## 2024-05-09 DIAGNOSIS — E1122 Type 2 diabetes mellitus with diabetic chronic kidney disease: Secondary | ICD-10-CM | POA: Diagnosis not present

## 2024-05-09 DIAGNOSIS — N182 Chronic kidney disease, stage 2 (mild): Secondary | ICD-10-CM | POA: Diagnosis not present

## 2024-05-09 DIAGNOSIS — E039 Hypothyroidism, unspecified: Secondary | ICD-10-CM | POA: Diagnosis not present

## 2024-05-09 DIAGNOSIS — E785 Hyperlipidemia, unspecified: Secondary | ICD-10-CM | POA: Diagnosis not present

## 2024-05-16 DIAGNOSIS — Z1331 Encounter for screening for depression: Secondary | ICD-10-CM | POA: Diagnosis not present

## 2024-05-16 DIAGNOSIS — E1122 Type 2 diabetes mellitus with diabetic chronic kidney disease: Secondary | ICD-10-CM | POA: Diagnosis not present

## 2024-05-16 DIAGNOSIS — N182 Chronic kidney disease, stage 2 (mild): Secondary | ICD-10-CM | POA: Diagnosis not present

## 2024-05-16 DIAGNOSIS — B009 Herpesviral infection, unspecified: Secondary | ICD-10-CM | POA: Diagnosis not present

## 2024-05-16 DIAGNOSIS — E049 Nontoxic goiter, unspecified: Secondary | ICD-10-CM | POA: Diagnosis not present

## 2024-05-16 DIAGNOSIS — K439 Ventral hernia without obstruction or gangrene: Secondary | ICD-10-CM | POA: Diagnosis not present

## 2024-05-16 DIAGNOSIS — I129 Hypertensive chronic kidney disease with stage 1 through stage 4 chronic kidney disease, or unspecified chronic kidney disease: Secondary | ICD-10-CM | POA: Diagnosis not present

## 2024-05-16 DIAGNOSIS — E785 Hyperlipidemia, unspecified: Secondary | ICD-10-CM | POA: Diagnosis not present

## 2024-05-16 DIAGNOSIS — Z0000 Encounter for general adult medical examination without abnormal findings: Secondary | ICD-10-CM | POA: Diagnosis not present

## 2024-05-16 DIAGNOSIS — Z1339 Encounter for screening examination for other mental health and behavioral disorders: Secondary | ICD-10-CM | POA: Diagnosis not present

## 2024-05-16 DIAGNOSIS — E039 Hypothyroidism, unspecified: Secondary | ICD-10-CM | POA: Diagnosis not present

## 2024-05-24 DIAGNOSIS — K439 Ventral hernia without obstruction or gangrene: Secondary | ICD-10-CM | POA: Diagnosis not present

## 2024-05-28 DIAGNOSIS — L918 Other hypertrophic disorders of the skin: Secondary | ICD-10-CM | POA: Diagnosis not present

## 2024-05-28 DIAGNOSIS — L821 Other seborrheic keratosis: Secondary | ICD-10-CM | POA: Diagnosis not present

## 2024-05-28 DIAGNOSIS — L700 Acne vulgaris: Secondary | ICD-10-CM | POA: Diagnosis not present

## 2024-05-28 DIAGNOSIS — L723 Sebaceous cyst: Secondary | ICD-10-CM | POA: Diagnosis not present

## 2024-06-18 DIAGNOSIS — N819 Female genital prolapse, unspecified: Secondary | ICD-10-CM | POA: Diagnosis not present
